# Patient Record
Sex: Male | Born: 1975 | Race: White | Hispanic: No | Marital: Married | Smoking: Former smoker
Health system: Southern US, Community
[De-identification: ages and names within clinical notes are randomized; demographics above are authoritative.]

## PROBLEM LIST (undated history)

## (undated) DIAGNOSIS — G43909 Migraine, unspecified, not intractable, without status migrainosus: Secondary | ICD-10-CM

## (undated) HISTORY — DX: Migraine, unspecified, not intractable, without status migrainosus: G43.909

---

## 2006-03-22 ENCOUNTER — Ambulatory Visit: Payer: Self-pay | Admitting: Family Medicine

## 2006-03-27 ENCOUNTER — Ambulatory Visit: Payer: Self-pay | Admitting: Family Medicine

## 2006-03-28 ENCOUNTER — Emergency Department: Payer: Self-pay

## 2006-04-16 ENCOUNTER — Ambulatory Visit: Payer: Self-pay | Admitting: Family Medicine

## 2006-06-06 ENCOUNTER — Ambulatory Visit: Payer: Self-pay | Admitting: Family Medicine

## 2007-05-28 DIAGNOSIS — G43909 Migraine, unspecified, not intractable, without status migrainosus: Secondary | ICD-10-CM | POA: Insufficient documentation

## 2007-07-17 ENCOUNTER — Ambulatory Visit: Payer: Self-pay | Admitting: Family Medicine

## 2007-11-05 ENCOUNTER — Ambulatory Visit: Payer: Self-pay | Admitting: Family Medicine

## 2007-11-05 LAB — CONVERTED CEMR LAB
Albumin: 4.6 g/dL (ref 3.5–5.2)
BUN: 14 mg/dL (ref 6–23)
Creatinine, Ser: 1 mg/dL (ref 0.4–1.5)
Direct LDL: 144.7 mg/dL
GFR calc Af Amer: 111 mL/min
GFR calc non Af Amer: 92 mL/min
HDL: 54.6 mg/dL (ref >39.0)
Potassium: 4.7 meq/L (ref 3.5–5.1)
Total CHOL/HDL Ratio: 3.8
VLDL: 11 mg/dL (ref 0–40)

## 2007-11-12 ENCOUNTER — Ambulatory Visit: Payer: Self-pay | Admitting: Family Medicine

## 2007-11-12 DIAGNOSIS — G5600 Carpal tunnel syndrome, unspecified upper limb: Secondary | ICD-10-CM

## 2007-11-12 LAB — CONVERTED CEMR LAB
Blood in Urine, dipstick: NEGATIVE
Ketones, urine, test strip: NEGATIVE
Nitrite: NEGATIVE
Protein, U semiquant: NEGATIVE

## 2007-11-14 ENCOUNTER — Encounter: Payer: Self-pay | Admitting: Family Medicine

## 2009-03-24 ENCOUNTER — Telehealth: Payer: Self-pay | Admitting: Family Medicine

## 2009-07-25 ENCOUNTER — Telehealth: Payer: Self-pay | Admitting: Family Medicine

## 2010-03-06 ENCOUNTER — Encounter (INDEPENDENT_AMBULATORY_CARE_PROVIDER_SITE_OTHER): Payer: Self-pay | Admitting: *Deleted

## 2010-08-01 ENCOUNTER — Telehealth: Payer: Self-pay | Admitting: Family Medicine

## 2010-08-09 ENCOUNTER — Ambulatory Visit: Admit: 2010-08-09 | Payer: Self-pay | Admitting: Family Medicine

## 2010-08-29 NOTE — Letter (Signed)
Summary: Dean Yang letter  [place] [facility]  [address] [place], [place] [address]   Phone: [phone]  Fax: [phone]       03/06/2010 MRN: [id number]  [facility] [address] [place], [place]  [address]  Dear [name],  [facility] announce the retirement of Arta Silence, M.D., from full-time practice at the [facility] office effective January 26, 2010 and his plans of returning part-time.  It is important to [name] and to our practice that you understand that [facility] has seven physicians in our office for your health care needs.  We will continue to offer the same exceptional care that you have today.    [name] has spoken to many of you about his plans for retirement and returning part-time in the fall.   We will continue to work with you through the transition to schedule appointments for you in the office and meet the high standards that [facility] is committed to.   Again, it is with great pleasure that we share the news that [name] will return to [facility] in October of 2011 with a reduced schedule.    If you have any questions, or would like to request an appointment with one of our physicians, please call us at [phone] and press the option for Scheduling an appointment.  We take pleasure in providing you with excellent patient care and look forward to seeing you at your next office visit.  Our [facility] Physicians are:  Tillman Abide, M.D. Laurita Quint, M.D. Roxy Manns, M.D. Kerby Nora, M.D. Hannah Beat, M.D. Ruthe Mannan, M.D. We proudly welcomed Raechel Ache, M.D. and Eustaquio Boyden, M.D. to the practice in July/August 2011.  Sincerely,  [facility]

## 2010-08-31 NOTE — Progress Notes (Signed)
Summary: Rx Maxalt  Phone Note Refill Request Call back at [phone] Message from:  [facility] on August 01, 2010 9:30 AM  Refills Requested: Medication #1:  MAXALT-MLT 10 MG  TBDP Take one by mouth as directed   Last Refilled: 07/26/2009 Patient has not been seen in over a year.   Method Requested: Electronic Initial call taken by: Sydell Axon LPN,  August 01, 2010 9:31 AM  Follow-up for Phone Call        Pt hasn't been seen in 2 years. Needs to be seen for this refill. Follow-up by: Shaune Leeks MD,  August 01, 2010 12:24 PM  Additional Follow-up for Phone Call Additional follow up Details #1::        Pharmacy notified as instructed by telephone. Additional Follow-up by: Sydell Axon LPN,  August 01, 2010 2:50 PM

## 2010-09-13 ENCOUNTER — Telehealth: Payer: Self-pay | Admitting: Family Medicine

## 2010-09-20 NOTE — Progress Notes (Signed)
Summary: Rx Acyclovir  Phone Note Refill Request Call back at [phone] Message [facility]:  [id number] on September 13, 2010 9:14 AM  Refills Requested: Medication #1:  ACYCLOVIR 400 MG TABS one tab by mouth 5 times a day (every 4 hrs while awake) for 5 days.   Last Refilled: 04/21/2010 PATIENT HAS NOT BEEN SEEN IN 3 YEARS, HAS CANCELLED THE LAST 2 APPOINTMENTS.  Initial call taken by: Sydell Axon LPN,  September 13, 2010 9:14 AM  Follow-up for Phone Call        Needs to be seen. Follow-up by: Shaune Leeks MD,  September 13, 2010 9:46 AM  Additional Follow-up for Phone Call Additional follow up Details #1::        Pharmacy notified. Additional Follow-up by: Sydell Axon LPN,  September 13, 2010 9:47 AM

## 2010-12-15 NOTE — Assessment & Plan Note (Signed)
[facility]                                 ON-CALL NOTE   LONNEL, GJERDE                        MRN:          [id number]  DATE:08/10/2007                            DOB:          27-Nov-1975    PHONE NUMBER:  [phone].   CALLER:  Shloma Roggenkamp, the wife.   OBJECTIVE:  The patient has migraines and vomiting.  It started this  morning at 4.  He started vomiting at 5 and has done so about 4-5 times.  We do not have office hours on Sunday and was told to go to urgent care  where they should be able to treat him.   PRIMARY CARE Kameisha Malicki:  Arta Silence, M.D.   HOME OFFICE:  [place].     Arta Silence, MD  Electronically Signed    RNS/MedQ  DD: 08/10/2007  DT: 08/10/2007  Job #: [id number]

## 2010-12-15 NOTE — Assessment & Plan Note (Signed)
[facility]                             [place] OFFICE NOTE   Dean Yang, Dean Yang                        MRN:          [id number]  DATE:03/22/2006                            DOB:          Nov 12, 1975    This is  35 year old white male new to the practice.  Here to establish.  His wife is a patient here.  He lives in [place].  He has had a sinus  infection a couple of months ago and had to go to the walk-in clinic at  [facility]. Wanted to be seen here but was told as a new patient, he  could not do that.  He has had a few sinus infections in the past and has  had headaches that have been thought to be migraines. He is going to a  chiropractor which has helped somewhat and he has had an MRI of the head  that is presumably normal.  Has had mild muscle strain as well.  Imitrex was  of no help to his headaches.  Maxalt did help some.  Phenergan he thinks was  the best because it helped him to sleep. He has no real problems today.   PAST MEDICAL HISTORY:  He has never been hospitalized for had surgery.  Denies rheumatic fever, scarlet fever, or high blood pressure or heart  disease, asthma, liver disease, diabetes, or thyroid disease.  He had  pneumonia in February 2005 with pleurisy and has had strep in the past.   ALLERGIES:  No known drug allergies.   HABITS:  Smoked a pack a day for about 10 years.  Quit in February of 2005.  Drinks once a year. Does not use drugs.   MEDICATIONS:  Is on no medicines except for occasional migraine medicines.   REVIEW OF SYSTEMS:  Significant for headache once a month, ringing of the  ears with loud noises. Last physical exam was in May.  Has a mid upper back  tattoo done professionally. Otherwise HEENT, teeth and gums,  cardiorespiratory, gastrointestinal, and genitourinary systems are  noncontributory.   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  He is married.  Lives  with his wife and two  children, 20 year old and 46-year-old daughters.   FAMILY HISTORY:  Father is alive at the age of 32.  Mother is alive at the  age of 60.  One sister alive at the age of 80.  Paternal grandfather died of  presumably lung cancer from smoking.  Last tetanus shot was in 2000.   PHYSICAL EXAMINATION:  GENERAL:  This is a well-developed, well-nourished,  35 year old white male in no acute distress.  VITAL SIGNS:  Temperature 97.8, pulse 56, blood pressure 106/66.  Weight is  160 pounds, height 74 inches with no known drug allergies.  HEENT:  Within normal limits.  NECK:  Without adenopathy.  Thyroid without nodularity.  LUNGS:  Clear to auscultation.  BACK:  Straight and nontender with no CVA tenderness.  HEART:  Regular rate, without murmur.  Pulses 2+ throughout.  Carotids  without bruits.  CHEST:  Symmetric to excursion.  ABDOMEN:  Soft, nontender, good bowel sounds.  No masses.  GU:  Testicles are distended bilaterally without nodularity.  No hernias or  adenopathy are noted.  RECTAL:  No rectal was done due to age.  MUSCULOSKELETAL: Muscle strength is 5/5 with range of motion, gait and  mobility normal.  EXTREMITIES:  Without clubbing, cyanosis or edema.  SKIN:  Significant for a sun tattoo in the mid upper back.  Otherwise  benign.   ASSESSMENT:  Health maintenance physical examination with migraine headaches  and upper respiratory infection.   PLAN:  Trial of Maxalt as needed, maybe be repeated once in two hours.  Samples were given.  Call if he needs more.  Robitussin plain two  tablespoons morning and at noon for four or five days with inflammation and  Aleve two with breakfast and supper for four to five days with inflammation.  We discussed alcohol, drugs, and driving.  Return for fasting labs.  He has  used Toradol and Phenergan for headaches in the past which has helped.                                   Arta Silence, MD   RNS/MedQ  DD:  03/22/2006  DT:   03/23/2006  Job #:  [id number]

## 2014-02-12 ENCOUNTER — Ambulatory Visit: Payer: Self-pay | Admitting: General Practice

## 2015-05-24 ENCOUNTER — Ambulatory Visit: Payer: Self-pay | Admitting: Physician Assistant

## 2015-05-24 ENCOUNTER — Encounter: Payer: Self-pay | Admitting: Physician Assistant

## 2015-05-24 VITALS — BP 110/70 | HR 87 | Temp 98.6°F

## 2015-05-24 DIAGNOSIS — G43019 Migraine without aura, intractable, without status migrainosus: Secondary | ICD-10-CM

## 2015-05-24 MED ORDER — PROMETHAZINE HCL 25 MG PO TABS: 25.0000 mg | ORAL_TABLET | Freq: Three times a day (TID) | ORAL | Status: DC | PRN

## 2015-05-24 MED ORDER — KETOROLAC TROMETHAMINE 10 MG PO TABS: 10.0000 mg | ORAL_TABLET | Freq: Four times a day (QID) | ORAL | Status: DC | PRN

## 2015-05-24 MED ORDER — KETOROLAC TROMETHAMINE 60 MG/2ML IM SOLN: 60.0000 mg | Freq: Once | INTRAMUSCULAR | Status: AC

## 2015-05-24 NOTE — Patient Instructions (Signed)
Recurrent Migraine Headache A migraine headache is an intense, throbbing pain on one or both sides of your head. Recurrent migraines keep coming back. A migraine can last for 30 minutes to several hours. CAUSES  The exact cause of a migraine headache is not always known. However, a migraine may be caused when nerves in the brain become irritated and release chemicals that cause inflammation. This causes pain. Certain things may also trigger migraines, such as:   Alcohol.  Smoking.  Stress.  Menstruation.  Aged cheeses.  Foods or drinks that contain nitrates, glutamate, aspartame, or tyramine.  Lack of sleep.  Chocolate.  Caffeine.  Hunger.  Physical exertion.  Fatigue.  Medicines used to treat chest pain (nitroglycerine), birth control pills, estrogen, and some blood pressure medicines. SYMPTOMS   Pain on one or both sides of your head.  Pulsating or throbbing pain.  Severe pain that prevents daily activities.  Pain that is aggravated by any physical activity.  Nausea, vomiting, or both.  Dizziness.  Pain with exposure to bright lights, loud noises, or activity.  General sensitivity to bright lights, loud noises, or smells. Before you get a migraine, you may get warning signs that a migraine is coming (aura). An aura may include:  Seeing flashing lights.  Seeing bright spots, halos, or zigzag lines.  Having tunnel vision or blurred vision.  Having feelings of numbness or tingling.  Having trouble talking.  Having muscle weakness. DIAGNOSIS  A recurrent migraine headache is often diagnosed based on:  Symptoms.  Physical examination.  A CT scan or MRI of your head. These imaging tests cannot diagnose migraines but can help rule out other causes of headaches.  TREATMENT  Medicines may be given for pain and nausea. Medicines can also be given to help prevent recurrent migraines. HOME CARE INSTRUCTIONS  Only take over-the-counter or prescription  medicines for pain or discomfort as directed by your health care provider. The use of long-term narcotics is not recommended.  Lie down in a dark, quiet room when you have a migraine.  Keep a journal to find out what may trigger your migraine headaches. For example, write down:  What you eat and drink.  How much sleep you get.  Any change to your diet or medicines.  Limit alcohol consumption.  Quit smoking if you smoke.  Get 7-9 hours of sleep, or as recommended by your health care provider.  Limit stress.  Keep lights dim if bright lights bother you and make your migraines worse. SEEK MEDICAL CARE IF:   You do not get relief from the medicines given to you.  You have a recurrence of pain.  You have a fever. SEEK IMMEDIATE MEDICAL CARE IF:  Your migraine becomes severe.  You have a stiff neck.  You have loss of vision.  You have muscular weakness or loss of muscle control.  You start losing your balance or have trouble walking.  You feel faint or pass out.  You have severe symptoms that are different from your first symptoms. MAKE SURE YOU:   Understand these instructions.  Will watch your condition.  Will get help right away if you are not doing well or get worse.   This information is not intended to replace advice given to you by your health care provider. Make sure you discuss any questions you have with your health care provider.   Document Released: 04/10/2001 Document Revised: 08/06/2014 Document Reviewed: 03/23/2013 Elsevier Interactive Patient Education [employer].

## 2015-05-24 NOTE — Progress Notes (Signed)
S: c/o migraine headache, hx of same, usually can head them off with maxalt, but this time he woke up with headache, took maxalt and it didn't help, usually urgent care will give him toradol and phenergan and he gets better, vomited x 5 due to pain, no slurred speech or neuro deficits per wife, denies fever/chills  O: vitals wnl, nad, pt holding head in pain, sensitive to light and sound, perrl eomi, lungs c t a, cv rrr , neuro intact; rechecked pt after injection, states pain decreased  A: acute migraine  P; toradol 80m im here in clinic, rx for phenergan 263m#20 nr, toradol 1061m20 nr

## 2016-02-22 IMAGING — CR RIGHT THUMB 2+V
1 series · 3 of 3 positions shown · non-contrast
Comparison: None.

CLINICAL DATA: Pain and swelling for 1 week. Limited range of
motion. No trauma.

EXAM:
RIGHT THUMB 2+V

[Series 1: kdxr thumb right hand (1st digit · 0.14mm/px · 3 of 3 slices shown]
[im 1/3]
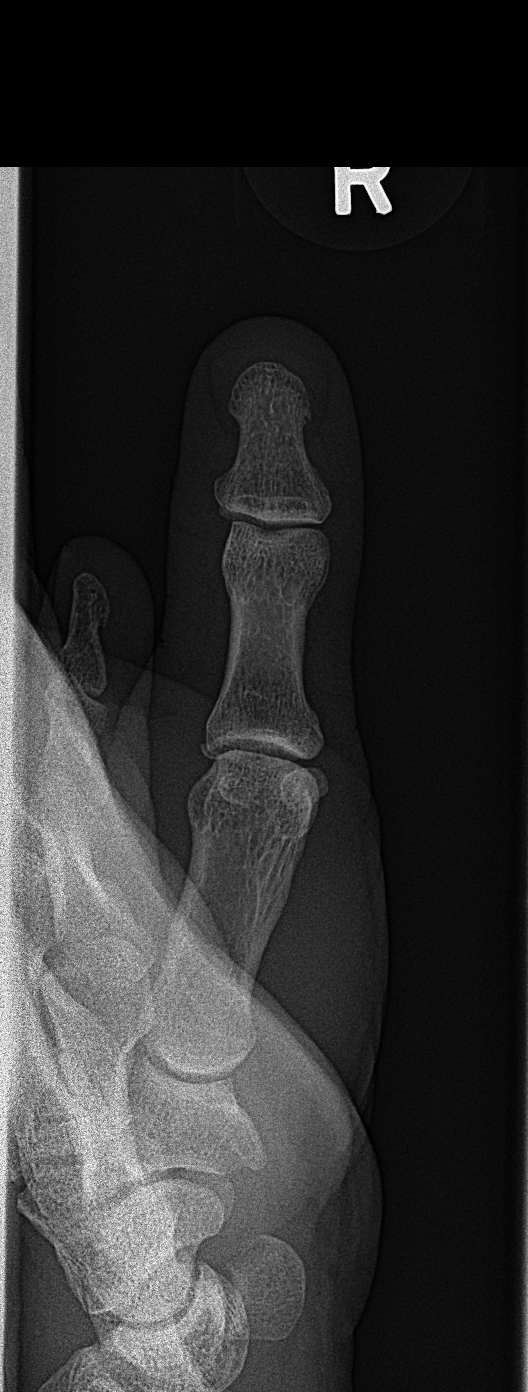
[im 2/3]
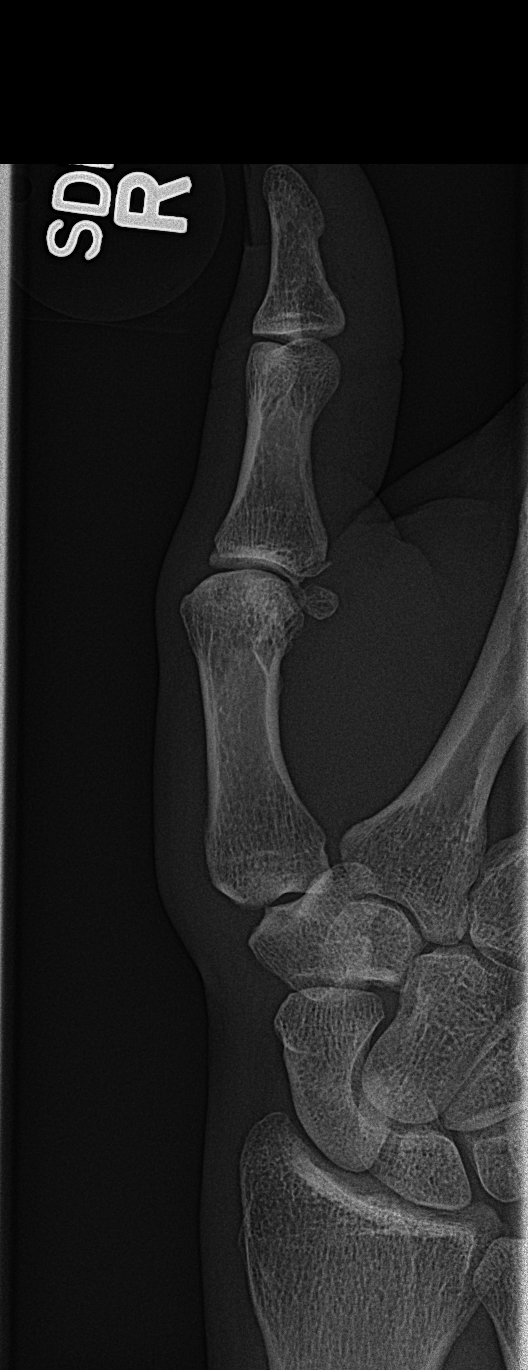
[im 3/3]
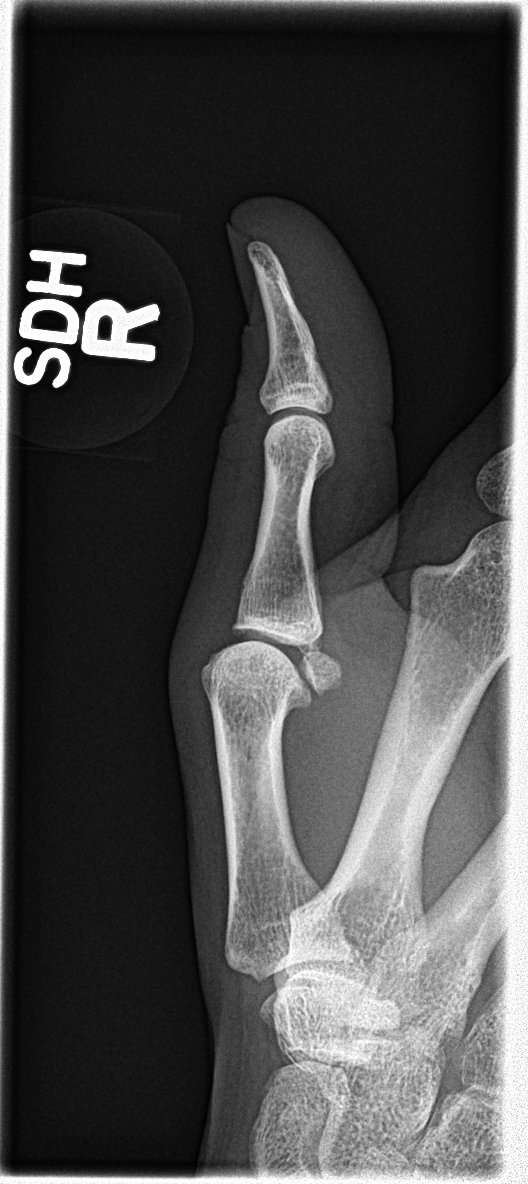

[3 of 3 positions shown; findings below may reference images not displayed]

FINDINGS: Mild joint space narrowing and subchondral sclerosis involving the
first metacarpal phalangeal joint. On the oblique image, a
nondisplaced to minimally displaced fracture is identified about the
ulnar and volar side of the proximal portion of the proximal
phalanx. This is likely intra-articular. Mild soft tissue swelling.
IMPRESSION: Mild degenerative changes of the first metatarsal phalangeal joint.
Fracture involving the proximal aspect of the proximal phalanx is
intra-articular and favored to be acute or subacute.

## 2016-12-25 ENCOUNTER — Encounter: Payer: Self-pay | Admitting: Physician Assistant

## 2016-12-25 ENCOUNTER — Ambulatory Visit: Payer: Self-pay | Admitting: Physician Assistant

## 2016-12-25 VITALS — BP 113/73 | HR 69 | Temp 98.5°F | Ht 74.0 in | Wt 168.0 lb

## 2016-12-25 DIAGNOSIS — Z0000 Encounter for general adult medical examination without abnormal findings: Secondary | ICD-10-CM

## 2016-12-25 MED ORDER — KETOROLAC TROMETHAMINE 10 MG PO TABS: 10.0000 mg | ORAL_TABLET | Freq: Four times a day (QID) | ORAL | 1 refills | Status: DC | PRN

## 2016-12-25 MED ORDER — RIZATRIPTAN BENZOATE 10 MG PO TBDP: 10.0000 mg | ORAL_TABLET | ORAL | 6 refills | Status: DC | PRN

## 2016-12-25 NOTE — Progress Notes (Signed)
S: pt here for wellness physical had  biometrics for insurance purposes done at work, total cholesterol was 186, hdl 46, glucose 97 , no complaints ros neg. PMH:   migraines Social:  Former smoker Fam:as noted on chart  O: vitals wnl, nad, ENT wnl, neck supple no lymph, lungs c t a, cv rrr, abd soft nontender bs normal all 4 quads  A: wellness physical  P: return in July/august for repeat physical and fasting labs

## 2017-12-12 ENCOUNTER — Ambulatory Visit: Payer: Self-pay | Admitting: Family Medicine

## 2017-12-12 VITALS — BP 126/64 | HR 56 | Temp 98.2°F | Resp 14 | Ht 73.0 in | Wt 172.0 lb

## 2017-12-12 DIAGNOSIS — Z0189 Encounter for other specified special examinations: Principal | ICD-10-CM

## 2017-12-12 DIAGNOSIS — Z008 Encounter for other general examination: Secondary | ICD-10-CM

## 2017-12-12 LAB — GLUCOSE, POCT (MANUAL RESULT ENTRY): POC GLUCOSE: 96 mg/dL (ref 70–99)

## 2017-12-12 NOTE — Progress Notes (Signed)
Subjective: Annual biometrics screening  Patient presents for his annual biometric screening. Patient reports eating a healthy, well-rounded diet and getting regular exercise. Patient works for the [employer]. Patient denies any other issues or concerns.   Review of Systems Unremarkable  Objective  Physical Exam General: Awake, alert and oriented. No acute distress. Well developed, hydrated and nourished. Appears stated age.  HEENT: Supple neck without adenopathy. Sclera is non-icteric. The ear canal is clear without discharge. The tympanic membrane is normal in appearance with normal landmarks and cone of light. Nasal mucosa is pink and moist. Oral mucosa is pink and moist. The pharynx is normal in appearance without tonsillar swelling or exudates.  Skin: Skin in warm, dry and intact without rashes or lesions. Appropriate color for ethnicity. Cardiac: Heart rate and rhythm are normal. No murmurs, gallops, or rubs are auscultated.  Respiratory: The chest wall is symmetric and without deformity. No signs of respiratory distress. Lung sounds are clear in all lobes bilaterally without rales, ronchi, or wheezes.  Neurological: The patient is awake, alert and oriented to person, place, and time with normal speech.  Memory is normal and thought processes intact. No gait abnormalities are appreciated.  Psychiatric: Appropriate mood and affect.   Assessment Annual biometrics screening  Plan  Lipid panel pending. Encouraged routine visits with primary care provider.  Fasting blood sugar is 96 today. Encouraged patient to get regular exercise and eat a healthy, well-rounded diet.

## 2017-12-13 LAB — LIPID PANEL
Chol/HDL Ratio: 4 ratio (ref 0.0–5.0)
Cholesterol, Total: 218 mg/dL — ABNORMAL HIGH (ref 100–199)
HDL: 55 mg/dL (ref >39)
LDL CALC: 144 mg/dL — AB (ref 0–99)
Triglycerides: 93 mg/dL (ref 0–149)
VLDL CHOLESTEROL CAL: 19 mg/dL (ref 5–40)

## 2017-12-16 NOTE — Progress Notes (Signed)
Dean Yang, Will you call the patient and inform them that their lipid panel came back?  Everything is normal, with the exception of his total cholesterol and LDL cholesterol.  The total cholesterol is elevated at 218, normal values are between 100 and 199. The LDL cholesterol ("bad cholesterol") is elevated at 144, normal values are below 99. Please advise the patient to follow-up with their primary care provider regarding these results.

## 2018-12-29 HISTORY — PX: WISDOM TOOTH EXTRACTION: SHX21

## 2019-02-03 ENCOUNTER — Other Ambulatory Visit: Payer: Self-pay

## 2019-02-03 ENCOUNTER — Ambulatory Visit: Payer: Managed Care, Other (non HMO) | Admitting: Adult Health

## 2019-02-03 ENCOUNTER — Telehealth: Payer: Self-pay | Admitting: *Deleted

## 2019-02-03 ENCOUNTER — Encounter: Payer: Self-pay | Admitting: Adult Health

## 2019-02-03 DIAGNOSIS — Z20822 Contact with and (suspected) exposure to covid-19: Secondary | ICD-10-CM

## 2019-02-03 DIAGNOSIS — Z20828 Contact with and (suspected) exposure to other viral communicable diseases: Secondary | ICD-10-CM | POA: Diagnosis not present

## 2019-02-03 NOTE — Patient Instructions (Signed)
Person Under Monitoring Name: Dean Yang  Location: [address] [place] [address]   Infection Prevention Recommendations for Individuals Confirmed to have, or Being Evaluated for, 2019 Novel Coronavirus (COVID-19) Infection Who Receive Care at Home  Individuals who are confirmed to have, or are being evaluated for, COVID-19 should follow the prevention steps below until a healthcare provider or local or state health department says they can return to normal activities.  Stay home except to get medical care You should restrict activities outside your home, except for getting medical care. Do not go to work, school, or public areas, and do not use public transportation or taxis.  Call ahead before visiting your doctor Before your medical appointment, call the healthcare provider and tell them that you have, or are being evaluated for, COVID-19 infection. This will help the healthcare provider's office take steps to keep other people from getting infected. Ask your healthcare provider to call the local or state health department.  Monitor your symptoms Seek prompt medical attention if your illness is worsening (e.g., difficulty breathing). Before going to your medical appointment, call the healthcare provider and tell them that you have, or are being evaluated for, COVID-19 infection. Ask your healthcare provider to call the local or state health department.  Wear a facemask You should wear a facemask that covers your nose and mouth when you are in the same room with other people and when you visit a healthcare provider. People who live with or visit you should also wear a facemask while they are in the same room with you.  Separate yourself from other people in your home As much as possible, you should stay in a different room from other people in your home. Also, you should use a separate bathroom, if available.  Avoid sharing household items You should not share  dishes, drinking glasses, cups, eating utensils, towels, bedding, or other items with other people in your home. After using these items, you should wash them thoroughly with soap and water.  Cover your coughs and sneezes Cover your mouth and nose with a tissue when you cough or sneeze, or you can cough or sneeze into your sleeve. Throw used tissues in a lined trash can, and immediately wash your hands with soap and water for at least 20 seconds or use an alcohol-based hand rub.  Wash your [facility] your hands often and thoroughly with soap and water for at least 20 seconds. You can use an alcohol-based hand sanitizer if soap and water are not available and if your hands are not visibly dirty. Avoid touching your eyes, nose, and mouth with unwashed hands.   Prevention Steps for Caregivers and Household Members of Individuals Confirmed to have, or Being Evaluated for, COVID-19 Infection Being Cared for in the Home  If you live with, or provide care at home for, a person confirmed to have, or being evaluated for, COVID-19 infection please follow these guidelines to prevent infection:  Follow healthcare provider's instructions Make sure that you understand and can help the patient follow any healthcare provider instructions for all care.  Provide for the patient's basic needs You should help the patient with basic needs in the home and provide support for getting groceries, prescriptions, and other personal needs.  Monitor the patient's symptoms If they are getting sicker, call his or her medical provider and tell them that the patient has, or is being evaluated for, COVID-19 infection. This will help the healthcare provider's  office take steps to keep other people from getting infected. Ask the healthcare provider to call the local or state health department.  Limit the number of people who have contact with the patient  If possible, have only one caregiver for the patient.  Other  household members should stay in another home or place of residence. If this is not possible, they should stay  in another room, or be separated from the patient as much as possible. Use a separate bathroom, if available.  Restrict visitors who do not have an essential need to be in the home.  Keep older adults, very young children, and other sick people away from the patient Keep older adults, very young children, and those who have compromised immune systems or chronic health conditions away from the patient. This includes people with chronic heart, lung, or kidney conditions, diabetes, and cancer.  Ensure good ventilation Make sure that shared spaces in the home have good air flow, such as from an air conditioner or an opened window, weather permitting.  Wash your hands often  Wash your hands often and thoroughly with soap and water for at least 20 seconds. You can use an alcohol based hand sanitizer if soap and water are not available and if your hands are not visibly dirty.  Avoid touching your eyes, nose, and mouth with unwashed hands.  Use disposable paper towels to dry your hands. If not available, use dedicated cloth towels and replace them when they become wet.  Wear a facemask and gloves  Wear a disposable facemask at all times in the room and gloves when you touch or have contact with the patient's blood, body fluids, and/or secretions or excretions, such as sweat, saliva, sputum, nasal mucus, vomit, urine, or feces.  Ensure the mask fits over your nose and mouth tightly, and do not touch it during use.  Throw out disposable facemasks and gloves after using them. Do not reuse.  Wash your hands immediately after removing your facemask and gloves.  If your personal clothing becomes contaminated, carefully remove clothing and launder. Wash your hands after handling contaminated clothing.  Place all used disposable facemasks, gloves, and other waste in a lined container before  disposing them with other household waste.  Remove gloves and wash your hands immediately after handling these items.  Do not share dishes, glasses, or other household items with the patient  Avoid sharing household items. You should not share dishes, drinking glasses, cups, eating utensils, towels, bedding, or other items with a patient who is confirmed to have, or being evaluated for, COVID-19 infection.  After the person uses these items, you should wash them thoroughly with soap and water.  Wash laundry thoroughly  Immediately remove and wash clothes or bedding that have blood, body fluids, and/or secretions or excretions, such as sweat, saliva, sputum, nasal mucus, vomit, urine, or feces, on them.  Wear gloves when handling laundry from the patient.  Read and follow directions on labels of laundry or clothing items and detergent. In general, wash and dry with the warmest temperatures recommended on the label.  Clean all areas the individual has used often  Clean all touchable surfaces, such as counters, tabletops, doorknobs, bathroom fixtures, toilets, phones, keyboards, tablets, and bedside tables, every day. Also, clean any surfaces that may have blood, body fluids, and/or secretions or excretions on them.  Wear gloves when cleaning surfaces the patient has come in contact with.  Use a diluted bleach solution (e.g., dilute bleach with 1  part bleach and 10 parts water) or a household disinfectant with a label that says EPA-registered for coronaviruses. To make a bleach solution at home, add 1 tablespoon of bleach to 1 quart (4 cups) of water. For a larger supply, add  cup of bleach to 1 gallon (16 cups) of water.  Read labels of cleaning products and follow recommendations provided on product labels. Labels contain instructions for safe and effective use of the cleaning product including precautions you should take when applying the product, such as wearing gloves or eye protection  and making sure you have good ventilation during use of the product.  Remove gloves and wash hands immediately after cleaning.  Monitor yourself for signs and symptoms of illness Caregivers and household members are considered close contacts, should monitor their health, and will be asked to limit movement outside of the home to the extent possible. Follow the monitoring steps for close contacts listed on the symptom monitoring form.   ? If you have additional questions, contact your local health department or call the epidemiologist on call at [phone] (available 24/7). ? This guidance is subject to change. For the most up-to-date guidance from [facility], please refer to their website: [email or web address]

## 2019-02-03 NOTE — Telephone Encounter (Signed)
-----   Message from Doreen Beam, [place] sent at 02/03/2019 11:17 AM EDT ----- Needs covid testing direct exposure

## 2019-02-03 NOTE — Telephone Encounter (Signed)
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 In order to return to work he needs 2 negative covid tests 24 hours apart. Instructions given and order placed. Appt made 02/04/19 @ 10:00 am @ [facility] and appt made 02/05/19 @ 1:00pm [facility].

## 2019-02-03 NOTE — Progress Notes (Signed)
Virtual Visit via Video Note  I connected with Dean Yang on 02/03/19 at 10:30 AM EDT by a video enabled telemedicine application and verified that I am speaking with the correct person using two identifiers.  Location: Patient: at home  Provider: [facility], [place], [place]     I discussed the limitations of evaluation and management by telemedicine and the availability of in person appointments. The patient expressed understanding and agreed to proceed.  History of Present Illness:  Patient is a 43 year old male in no acute distress who calls clinic for telephone visit.  He was exposed to Covid 19 at work July 1st.   [facility]. His wife has lupus immunocompromises with lupus. He has no results.  He is asymptomatic as is all his family.    Patient  denies any fever, body aches,chills, rash, chest pain, shortness of breath, nausea, vomiting, or diarrhea.  He is not immunocompromised.    Observations/Objective: Afebrile. No other vitals available.  Patient is alert and oriented and responsive to questions Engages in conversation with provider. Speaks in full sentences without any pauses without any shortness of breath or distress.    Assessment and Plan:  1. Close Exposure to Covid-19 Virus     Follow Up Instructions   Discussed RED FLAGS of Covid 19 and to go to the emergency room for any respiratory distress or moderate to severe symptoms, Covid 19 drive through testing at the Covid 19 testing center has been ordered. Testing center will call results. Work note will be sent to your My Chart account. You must stay out to the day on your work note and you will not be allowed to return unless you are fever free for 72 hours without fever reducers as well. If patient is symptomatic or has fever on return to work date they will call the office and stay home for further instructions. Call the clinic should any  symptoms change or worsen at any time. Seek emergency treatment if needed any time.    I discussed the assessment and treatment plan with the patient. The patient was provided an opportunity to ask questions and all were answered. The patient agreed with the plan and demonstrated an understanding of the instructions.   The patient was advised to call back or seek an in-person evaluation if the symptoms worsen or if the condition fails to improve as anticipated.  I provided 20 minutes of non-face-to-face time during this encounter.   Marcille Buffy, FNP

## 2019-02-04 ENCOUNTER — Other Ambulatory Visit: Payer: Self-pay

## 2019-02-04 DIAGNOSIS — Z20822 Contact with and (suspected) exposure to covid-19: Secondary | ICD-10-CM

## 2019-02-05 ENCOUNTER — Other Ambulatory Visit: Payer: Self-pay

## 2019-02-09 LAB — NOVEL CORONAVIRUS, NAA: SARS-CoV-2, NAA: NOT DETECTED

## 2019-02-12 LAB — NOVEL CORONAVIRUS, NAA: SARS-CoV-2, NAA: NOT DETECTED

## 2019-03-02 ENCOUNTER — Encounter: Payer: Managed Care, Other (non HMO) | Admitting: Adult Health

## 2019-03-05 ENCOUNTER — Ambulatory Visit: Payer: Managed Care, Other (non HMO) | Admitting: Adult Health

## 2019-03-05 ENCOUNTER — Encounter: Payer: Self-pay | Admitting: Adult Health

## 2019-03-05 ENCOUNTER — Other Ambulatory Visit: Payer: Self-pay

## 2019-03-05 VITALS — BP 117/72 | HR 67 | Temp 98.1°F | Resp 16 | Ht 74.0 in | Wt 168.0 lb

## 2019-03-05 DIAGNOSIS — Z760 Encounter for issue of repeat prescription: Secondary | ICD-10-CM

## 2019-03-05 DIAGNOSIS — Z0189 Encounter for other specified special examinations: Secondary | ICD-10-CM | POA: Diagnosis not present

## 2019-03-05 DIAGNOSIS — Z008 Encounter for other general examination: Secondary | ICD-10-CM

## 2019-03-05 DIAGNOSIS — Z8669 Personal history of other diseases of the nervous system and sense organs: Secondary | ICD-10-CM | POA: Diagnosis not present

## 2019-03-05 MED ORDER — RIZATRIPTAN BENZOATE 10 MG PO TBDP: 10.0000 mg | ORAL_TABLET | ORAL | 0 refills | Status: DC | PRN

## 2019-03-05 NOTE — Patient Instructions (Signed)
The Biometric exam is a brief physical with labs including glucose and cholesterol. This does not replace a full physical with a primary care provider, and additional recommended labs. This is an acute care clinic not for maintenance of chronic or long standing conditions.   Provider also recommends if you do not have a primary care provider for patient to establish care promptly.You can choose any provider of your choice at any facility of your choice, the below information is  just a resource to aid in you finding a primary care provider for routine health maintenance.   [facility]  PHYSICIAN/PROVIDER  REFERRAL LINE at [phone]  [email or web address] to help assist with finding a primary care doctor.   Helpful resources below of other primary care office's accepting new patients.   [facility]         [address]  [place]. [place] [address] [phone]  . [facility]    [address] [place], [place] [address] [phone]  . [facility] [address] [place], [place]  [phone]   . [place]  [address] [place] [address] [phone]    Follow up with primary care as needed for chronic and maintenance health care- can be seen in this employee clinic for acute care.     Health Maintenance, Male Adopting a healthy lifestyle and getting preventive care are important in promoting health and wellness. Ask your health care provider about:  The right schedule for you to have regular tests and exams.  Things you can do on your own to prevent diseases and keep yourself healthy. What should I know about diet, weight, and exercise? Eat a healthy diet   Eat a diet that includes plenty of vegetables, fruits, low-fat dairy products, and lean protein.  Do not eat a lot of foods that are high in solid fats, added sugars, or  sodium. Maintain a healthy weight Body mass index (BMI) is a measurement that can be used to identify possible weight problems. It estimates body fat based on height and weight. Your health care provider can help determine your BMI and help you achieve or maintain a healthy weight. Get regular exercise Get regular exercise. This is one of the most important things you can do for your health. Most adults should:  Exercise for at least 150 minutes each week. The exercise should increase your heart rate and make you sweat (moderate-intensity exercise).  Do strengthening exercises at least twice a week. This is in addition to the moderate-intensity exercise.  Spend less time sitting. Even light physical activity can be beneficial. Watch cholesterol and blood lipids Have your blood tested for lipids and cholesterol at 44 years of age, then have this test every 5 years. You may need to have your cholesterol levels checked more often if:  Your lipid or cholesterol levels are high.  You are older than 44 years of age.  You are at high risk for heart disease. What should I know about cancer screening? Many types of cancers can be detected early and may often be prevented. Depending on your health history and family history, you may need to have cancer screening at various ages. This may include screening for:  Colorectal cancer.  Prostate cancer.  Skin cancer.  Lung cancer. What should I know about heart disease, diabetes, and high blood pressure? Blood pressure and heart  disease  High blood pressure causes heart disease and increases the risk of stroke. This is more likely to develop in people who have high blood pressure readings, are of African descent, or are overweight.  Talk with your health care provider about your target blood pressure readings.  Have your blood pressure checked: ? Every 3-5 years if you are 56-16 years of age. ? Every year if you are 32 years old or older.   If you are between the ages of 55 and 16 and are a current or former smoker, ask your health care provider if you should have a one-time screening for abdominal aortic aneurysm (AAA). Diabetes Have regular diabetes screenings. This checks your fasting blood sugar level. Have the screening done:  Once every three years after age 62 if you are at a normal weight and have a low risk for diabetes.  More often and at a younger age if you are overweight or have a high risk for diabetes. What should I know about preventing infection? Hepatitis B If you have a higher risk for hepatitis B, you should be screened for this virus. Talk with your health care provider to find out if you are at risk for hepatitis B infection. Hepatitis C Blood testing is recommended for:  Everyone born from [birth year] through 1966.  Anyone with known risk factors for hepatitis C. Sexually transmitted infections (STIs)  You should be screened each year for STIs, including gonorrhea and chlamydia, if: ? You are sexually active and are younger than 44 years of age. ? You are older than 44 years of age and your health care provider tells you that you are at risk for this type of infection. ? Your sexual activity has changed since you were last screened, and you are at increased risk for chlamydia or gonorrhea. Ask your health care provider if you are at risk.  Ask your health care provider about whether you are at high risk for HIV. Your health care provider may recommend a prescription medicine to help prevent HIV infection. If you choose to take medicine to prevent HIV, you should first get tested for HIV. You should then be tested every 3 months for as long as you are taking the medicine. Follow these instructions at home: Lifestyle  Do not use any products that contain nicotine or tobacco, such as cigarettes, e-cigarettes, and chewing tobacco. If you need help quitting, ask your health care provider.  Do not use street  drugs.  Do not share needles.  Ask your health care provider for help if you need support or information about quitting drugs. Alcohol use  Do not drink alcohol if your health care provider tells you not to drink.  If you drink alcohol: ? Limit how much you have to 0-2 drinks a day. ? Be aware of how much alcohol is in your drink. In the [place] one drink equals one 12 oz bottle of beer (355 mL), one 5 oz glass of wine (148 mL), or one 1 oz glass of hard liquor (44 mL). General instructions  Schedule regular health, dental, and eye exams.  Stay current with your vaccines.  Tell your health care provider if: ? You often feel depressed. ? You have ever been abused or do not feel safe at home. Summary  Adopting a healthy lifestyle and getting preventive care are important in promoting health and wellness.  Follow your health care provider's instructions about healthy diet, exercising, and getting tested or screened for diseases.  Follow your health care provider's instructions on monitoring your cholesterol and blood pressure. This information is not intended to replace advice given to you by your health care provider. Make sure you discuss any questions you have with your health care provider. Document Released: 01/12/2008 Document Revised: 07/09/2018 Document Reviewed: 07/09/2018 Elsevier Patient Education  [place]. Rizatriptan tablets What is this medicine? RIZATRIPTAN (rye za TRIP tan) is used to treat migraines with or without aura. An aura is a strange feeling or visual disturbance that warns you of an attack. It is not used to prevent migraines. This medicine may be used for other purposes; ask your health care provider or pharmacist if you have questions. COMMON BRAND NAME(S): Maxalt What should I tell my health care provider before I take this medicine? They need to know if you have any of these conditions:  cigarette smoker  circulation problems in fingers  and toes  diabetes  heart disease  high blood pressure  high cholesterol  history of irregular heartbeat  history of stroke  kidney disease  liver disease  stomach or intestine problems  an unusual or allergic reaction to rizatriptan, other medicines, foods, dyes, or preservatives  pregnant or trying to get pregnant  breast-feeding How should I use this medicine? Take this medicine by mouth with a glass of water. Follow the directions on the prescription label. Do not take it more often than directed. Talk to your pediatrician regarding the use of this medicine in children. While this drug may be prescribed for children as young as 6 years for selected conditions, precautions do apply. Overdosage: If you think you have taken too much of this medicine contact a poison control center or emergency room at once. NOTE: This medicine is only for you. Do not share this medicine with others. What if I miss a dose? This does not apply. This medicine is not for regular use. What may interact with this medicine? Do not take this medicine with any of the following medicines:  certain medicines for migraine headache like almotriptan, eletriptan, frovatriptan, naratriptan, rizatriptan, sumatriptan, zolmitriptan  ergot alkaloids like dihydroergotamine, ergonovine, ergotamine, methylergonovine  MAOIs like Carbex, Eldepryl, Marplan, Nardil, and Parnate This medicine may also interact with the following medications:  certain medicines for depression, anxiety, or psychotic disorders  propranolol This list may not describe all possible interactions. Give your health care provider a list of all the medicines, herbs, non-prescription drugs, or dietary supplements you use. Also tell them if you smoke, drink alcohol, or use illegal drugs. Some items may interact with your medicine. What should I watch for while using this medicine? Visit your healthcare professional for regular checks on your  progress. Tell your healthcare professional if your symptoms do not start to get better or if they get worse. You may get drowsy or dizzy. Do not drive, use machinery, or do anything that needs mental alertness until you know how this medicine affects you. Do not stand up or sit up quickly, especially if you are an older patient. This reduces the risk of dizzy or fainting spells. Alcohol may interfere with the effect of this medicine. Your mouth may get dry. Chewing sugarless gum or sucking hard candy and drinking plenty of water may help. Contact your healthcare professional if the problem does not go away or is severe. If you take migraine medicines for 10 or more days a month, your migraines may get worse. Keep a diary of headache days and medicine use. Contact your  healthcare professional if your migraine attacks occur more frequently. What side effects may I notice from receiving this medicine? Side effects that you should report to your doctor or health care professional as soon as possible:  allergic reactions like skin rash, itching or hives, swelling of the face, lips, or tongue  chest pain or chest tightness  signs and symptoms of a dangerous change in heartbeat or heart rhythm like chest pain; dizziness; fast, irregular heartbeat; palpitations; feeling faint or lightheaded; falls; breathing problems  signs and symptoms of a stroke like changes in vision; confusion; trouble speaking or understanding; severe headaches; sudden numbness or weakness of the face, arm or leg; trouble walking; dizziness; loss of balance or coordination  signs and symptoms of serotonin syndrome like irritable; confusion; diarrhea; fast or irregular heartbeat; muscle twitching; stiff muscles; trouble walking; sweating; high fever; seizures; chills; vomiting Side effects that usually do not require medical attention (report to your doctor or health care professional if they continue or are bothersome):  diarrhea   dizziness  drowsiness  dry mouth  headache  nausea, vomiting  pain, tingling, numbness in the hands or feet  stomach pain This list may not describe all possible side effects. Call your doctor for medical advice about side effects. You may report side effects to FDA at 1-800-FDA-1088. Where should I keep my medicine? Keep out of the reach of children. Store at room temperature between 15 and 30 degrees C (59 and 86 degrees F). Keep container tightly closed. Throw away any unused medicine after the expiration date. NOTE: This sheet is a summary. It may not cover all possible information. If you have questions about this medicine, talk to your doctor, pharmacist, or health care provider.  2020 Elsevier/Gold Standard (2018-01-28 14:59:59) Migraine Headache A migraine headache is a very strong throbbing pain on one side or both sides of your head. This type of headache can also cause other symptoms. It can last from 4 hours to 3 days. Talk with your doctor about what things may bring on (trigger) this condition. What are the causes? The exact cause of this condition is not known. This condition may be triggered or caused by:  Drinking alcohol.  Smoking.  Taking medicines, such as: ? Medicine used to treat chest pain (nitroglycerin). ? Birth control pills. ? Estrogen. ? Some blood pressure medicines.  Eating or drinking certain products.  Doing physical activity. Other things that may trigger a migraine headache include:  Having a menstrual period.  Pregnancy.  Hunger.  Stress.  Not getting enough sleep or getting too much sleep.  Weather changes.  Tiredness (fatigue). What increases the risk?  Being 55-62 years old.  Being male.  Having a family history of migraine headaches.  Being Caucasian.  Having depression or anxiety.  Being very overweight. What are the signs or symptoms?  A throbbing pain. This pain may: ? Happen in any area of the head, such  as on one side or both sides. ? Make it hard to do daily activities. ? Get worse with physical activity. ? Get worse around bright lights or loud noises.  Other symptoms may include: ? Feeling sick to your stomach (nauseous). ? Vomiting. ? Dizziness. ? Being sensitive to bright lights, loud noises, or smells.  Before you get a migraine headache, you may get warning signs (an aura). An aura may include: ? Seeing flashing lights or having blind spots. ? Seeing bright spots, halos, or zigzag lines. ? Having tunnel vision or blurred  vision. ? Having numbness or a tingling feeling. ? Having trouble talking. ? Having weak muscles.  Some people have symptoms after a migraine headache (postdromal phase), such as: ? Tiredness. ? Trouble thinking (concentrating). How is this treated?  Taking medicines that: ? Relieve pain. ? Relieve the feeling of being sick to your stomach. ? Prevent migraine headaches.  Treatment may also include: ? Having acupuncture. ? Avoiding foods that bring on migraine headaches. ? Learning ways to control your body functions (biofeedback). ? Therapy to help you know and deal with negative thoughts (cognitive behavioral therapy). Follow these instructions at home: Medicines  Take over-the-counter and prescription medicines only as told by your doctor.  Ask your doctor if the medicine prescribed to you: ? Requires you to avoid driving or using heavy machinery. ? Can cause trouble pooping (constipation). You may need to take these steps to prevent or treat trouble pooping:  Drink enough fluid to keep your pee (urine) pale yellow.  Take over-the-counter or prescription medicines.  Eat foods that are high in fiber. These include beans, whole grains, and fresh fruits and vegetables.  Limit foods that are high in fat and sugar. These include fried or sweet foods. Lifestyle  Do not drink alcohol.  Do not use any products that contain nicotine or tobacco,  such as cigarettes, e-cigarettes, and chewing tobacco. If you need help quitting, ask your doctor.  Get at least 8 hours of sleep every night.  Limit and deal with stress. General instructions      Keep a journal to find out what may bring on your migraine headaches. For example, write down: ? What you eat and drink. ? How much sleep you get. ? Any change in what you eat or drink. ? Any change in your medicines.  If you have a migraine headache: ? Avoid things that make your symptoms worse, such as bright lights. ? It may help to lie down in a dark, quiet room. ? Do not drive or use heavy machinery. ? Ask your doctor what activities are safe for you.  Keep all follow-up visits as told by your doctor. This is important. Contact a doctor if:  You get a migraine headache that is different or worse than others you have had.  You have more than 15 headache days in one month. Get help right away if:  Your migraine headache gets very bad.  Your migraine headache lasts longer than 72 hours.  You have a fever.  You have a stiff neck.  You have trouble seeing.  Your muscles feel weak or like you cannot control them.  You start to lose your balance a lot.  You start to have trouble walking.  You pass out (faint).  You have a seizure. Summary  A migraine headache is a very strong throbbing pain on one side or both sides of your head. These headaches can also cause other symptoms.  This condition may be treated with medicines and changes to your lifestyle.  Keep a journal to find out what may bring on your migraine headaches.  Contact a doctor if you get a migraine headache that is different or worse than others you have had.  Contact your doctor if you have more than 15 headache days in a month. This information is not intended to replace advice given to you by your health care provider. Make sure you discuss any questions you have with your health care provider.  Document Released: 04/24/2008 Document Revised: 11/07/2018 Document  Reviewed: 08/28/2018 Elsevier Patient Education  [employer].

## 2019-03-05 NOTE — Progress Notes (Addendum)
[place] DOB: 43 y.o. MRN: [id number]  Subjective:  Here for Biometric Screen/brief exam  Patient is a 43 year old male in no acute distress who comes to the clinic for a biometric physical.   He has no concerns with his health at this time. He does have a history of migraines, he denies any change. He reports he maybe has 1- 2 migraines a month and has had them since he was a teenager.  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 He reports he had a primary care physicians last year that retired. He requests a one time refill on his Maxalt( prescription is on file).   Patient  denies any fever, body aches,chills, rash, chest pain, shortness of breath, nausea, vomiting, or diarrhea.   Objective: Physical Exam  Constitutional: He is oriented to person, place, and time and well-developed, well-nourished, and in no distress. No distress.  HENT:  Head: Normocephalic and atraumatic.  Eyes: Pupils are equal, round, and reactive to light. Conjunctivae and EOM are normal. Right eye exhibits no discharge. Left eye exhibits no discharge. No scleral icterus.  Neck: Normal range of motion. Neck supple. No JVD present.  Cardiovascular: Normal rate and regular rhythm. Exam reveals no gallop and no friction rub.  No murmur heard. Pulmonary/Chest: Effort normal and breath sounds normal. No respiratory distress. He has no wheezes. He has no rales. He exhibits no tenderness.  Abdominal: Soft.  Musculoskeletal: Normal range of motion.  Neurological: He is alert and oriented to person, place, and time. He has normal motor skills. He displays no weakness, facial symmetry, normal stance and normal reflexes. He exhibits normal muscle tone. He has a normal Romberg Test. Gait normal. Coordination and gait normal.  Skin: Skin is warm, dry and intact. No rash noted. He is not diaphoretic. No erythema.  No pallor. Nails show no clubbing.  Psychiatric: Mood, memory, affect and judgment normal.    Assessment:   ICD-10-CM   1. Encounter for other general examination- brief physical with biometric screening not a full annual physical   Z00.8   2. Encounter for biometric screening  Z01.89   3. Hx of migraines  Z86.69   4. Medication refill  Z76.0    Orders Placed This Encounter  Procedures  . CBC with Diff  . Comprehensive metabolic panel  . Lipid panel     Plan:  Meds ordered this encounter  Medications  . rizatriptan (MAXALT-MLT) 10 MG disintegrating tablet    Sig: Take 1 tablet (10 mg total) by mouth as needed for migraine. May repeat in 2 hours if needed/ see primary care for any refills    Dispense:  16 tablet    Refill:  0   Provided patient a list of primary care providers accepting new patients. He will seek care with primary care provider immediately and no additional refills will be given from this acute care clinic for maintenance/ chronic medications.  RED FLAGS with headache/ migraines ( thunder clap headache/  Worst headache in life and other neurological symptoms are discussed  and when to seek emergency medical attention.   Fasting glucose and lipids. Discussed with patient that today's visit here is a limited biometric screening visit (not a comprehensive exam or management of any chronic problems) Discussed some health issues, including healthy eating habits and exercise. Encouraged to follow-up with PCP for annual comprehensive preventive and wellness  care (and if applicable, any chronic issues). Questions invited and answered.

## 2019-03-06 ENCOUNTER — Encounter: Payer: Self-pay | Admitting: Adult Health

## 2019-03-06 LAB — COMPREHENSIVE METABOLIC PANEL
ALT: 18 IU/L (ref 0–44)
AST: 21 IU/L (ref 0–40)
Albumin/Globulin Ratio: 2 (ref 1.2–2.2)
Albumin: 5.1 g/dL — ABNORMAL HIGH (ref 4.0–5.0)
Alkaline Phosphatase: 64 IU/L (ref 39–117)
BUN/Creatinine Ratio: 10 (ref 9–20)
BUN: 12 mg/dL (ref 6–24)
Bilirubin Total: 0.8 mg/dL (ref 0.0–1.2)
CO2: 24 mmol/L (ref 20–29)
Calcium: 9.9 mg/dL (ref 8.7–10.2)
Chloride: 101 mmol/L (ref 96–106)
Creatinine, Ser: 1.15 mg/dL (ref 0.76–1.27)
GFR calc Af Amer: 89 mL/min/{1.73_m2} (ref >59)
GFR calc non Af Amer: 77 mL/min/{1.73_m2} (ref >59)
Globulin, Total: 2.5 g/dL (ref 1.5–4.5)
Glucose: 86 mg/dL (ref 65–99)
Potassium: 4.8 mmol/L (ref 3.5–5.2)
Sodium: 141 mmol/L (ref 134–144)
Total Protein: 7.6 g/dL (ref 6.0–8.5)

## 2019-03-06 LAB — CBC WITH DIFFERENTIAL/PLATELET
Basophils Absolute: 0 10*3/uL (ref 0.0–0.2)
Basos: 1 %
EOS (ABSOLUTE): 0 10*3/uL (ref 0.0–0.4)
Eos: 1 %
Hematocrit: 43.4 % (ref 37.5–51.0)
Hemoglobin: 14.5 g/dL (ref 13.0–17.7)
Immature Grans (Abs): 0 10*3/uL (ref 0.0–0.1)
Immature Granulocytes: 0 %
Lymphocytes Absolute: 1.2 10*3/uL (ref 0.7–3.1)
Lymphs: 39 %
MCH: 29.5 pg (ref 26.6–33.0)
MCHC: 33.4 g/dL (ref 31.5–35.7)
MCV: 88 fL (ref 79–97)
Monocytes Absolute: 0.2 10*3/uL (ref 0.1–0.9)
Monocytes: 7 %
Neutrophils Absolute: 1.6 10*3/uL (ref 1.4–7.0)
Neutrophils: 52 %
Platelets: 190 10*3/uL (ref 150–450)
RBC: 4.91 x10E6/uL (ref 4.14–5.80)
RDW: 12.3 % (ref 11.6–15.4)
WBC: 3 10*3/uL — ABNORMAL LOW (ref 3.4–10.8)

## 2019-03-06 LAB — LIPID PANEL
Chol/HDL Ratio: 3.7 ratio (ref 0.0–5.0)
Cholesterol, Total: 235 mg/dL — ABNORMAL HIGH (ref 100–199)
HDL: 64 mg/dL (ref >39)
LDL Calculated: 152 mg/dL — ABNORMAL HIGH (ref 0–99)
Triglycerides: 94 mg/dL (ref 0–149)
VLDL Cholesterol Cal: 19 mg/dL (ref 5–40)

## 2019-03-06 NOTE — Progress Notes (Signed)
Mr. Dean Yang,  Your white blood count is mildly low 3.0  (Normal range is 3.4-10.8). I recommend you having this rechecked in 3-4 weeks and sooner if have any new symptoms occur.  Your total cholesterol is 235 elevated as normal is 100-199. LDL bad cholesterol is elevated at 152 normal is 0-99. Please call the office if you have questions or concerns. Follow up with a primary care provider by 1 month.

## 2019-09-28 ENCOUNTER — Other Ambulatory Visit: Payer: Self-pay

## 2019-09-28 ENCOUNTER — Encounter: Payer: Self-pay | Admitting: Family Medicine

## 2019-09-28 ENCOUNTER — Ambulatory Visit: Payer: Managed Care, Other (non HMO) | Admitting: Family Medicine

## 2019-09-28 VITALS — BP 121/84 | HR 68 | Temp 97.5°F | Resp 16 | Ht 73.0 in | Wt 191.0 lb

## 2019-09-28 DIAGNOSIS — Z7689 Persons encountering health services in other specified circumstances: Secondary | ICD-10-CM

## 2019-09-28 DIAGNOSIS — G43709 Chronic migraine without aura, not intractable, without status migrainosus: Secondary | ICD-10-CM | POA: Diagnosis not present

## 2019-09-28 DIAGNOSIS — IMO0002 Reserved for concepts with insufficient information to code with codable children: Secondary | ICD-10-CM

## 2019-09-28 MED ORDER — KETOROLAC TROMETHAMINE 10 MG PO TABS: 10.0000 mg | ORAL_TABLET | Freq: Three times a day (TID) | ORAL | 1 refills | Status: DC | PRN

## 2019-09-28 MED ORDER — PROMETHAZINE HCL 12.5 MG PO TABS: 12.5000 mg | ORAL_TABLET | Freq: Three times a day (TID) | ORAL | 3 refills | Status: DC | PRN

## 2019-09-28 MED ORDER — RIZATRIPTAN BENZOATE 10 MG PO TBDP: 10.0000 mg | ORAL_TABLET | ORAL | 3 refills | Status: DC | PRN

## 2019-09-28 NOTE — Progress Notes (Signed)
Subjective:    Patient ID: Dean Yang, male    DOB: 22-May-1976, 44 y.o.   MRN: [id number]  Dean Yang is a 44 y.o. male presenting on 09/28/2019 for Establish Care (migraine)  Previous PCP no longer available. Now he goes to yearly biometric wellness through [facility].  HPI   Chronic Migraines - Chronic history of migraine headaches for years. - Current trend with Migraine headaches, seem to be once a month approximately, he describes if he can feel a migraine headache coming on with tension in neck and head, and if he takes Rizatriptan PRN and Phenergan half tab PRN but it does make him groggy and sleepy. Usually if he lays in a dark room and uses heating pad / tube socks to help settle it down, usually migraine resolve quickly. - He has seen Dr Laveda Abbe for chiropractor for neck adjustment to help with migraines. He keeps a lot of tension in muscles back in neck and seems to be related. Last visit 1 month ago. Previous chiro has retired, now he hasn't returned to new provider. - History of MRI Brain in past.  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 Migraine    Past Surgical History:  Procedure Laterality Date  . WISDOM TOOTH EXTRACTION  12/29/2018   Social History   Socioeconomic History  . Marital status: Married    Spouse name: Not on file  . Number of children: Not on file  . Years of education: Not on file  . Highest education level: Not on file  Occupational History  . Not on file  Tobacco  Use  . Smoking status: Former Research scientist (life sciences)  . Smokeless tobacco: Never Used  Substance and Sexual Activity  . Alcohol use: No    Alcohol/week: 0.0 standard drinks  . Drug use: No  . Sexual activity: Yes    Partners: Female  Other Topics Concern  . Not on file  Social History Narrative  . Not on file   Social Determinants of Health   Financial Resource Strain:   . Difficulty of Paying Living Expenses: Not on file  Food Insecurity:   . Worried About Charity fundraiser in the Last Year: Not on file  . Ran Out of Food in the Last Year: Not on file  Transportation Needs:   . Lack of Transportation (Medical): Not on file  . Lack of Transportation (Non-Medical): Not on file  Physical Activity:   . Days of Exercise per Week: Not on file  . Minutes of Exercise per Session: Not on file  Stress:   . Feeling of Stress : Not on file  Social Connections:   . Frequency of Communication with Friends and Family: Not on file  . Frequency of Social Gatherings with Friends and Family: Not on file  . Attends Religious Services: Not on file  . Active Member of Clubs or Organizations: Not on file  . Attends Archivist Meetings: Not on file  . Marital Status:  Not on file  Intimate Partner Violence:   . Fear of Current or Ex-Partner: Not on file  . Emotionally Abused: Not on file  . Physically Abused: Not on file  . Sexually Abused: Not on file   Family History  Problem Relation Age of Onset  . Heart disease Father 15       MI, CABG  . Diabetes Maternal Grandmother   . Stroke Maternal Grandfather   . Cancer Paternal Grandmother        lung cancer, smoker  . Cancer Paternal Grandfather        lung cancer, smoker   No current outpatient medications on file prior to visit.   No current facility-administered medications on file prior to visit.    Review of Systems Per HPI unless specifically indicated above     Objective:    BP 121/84   Pulse 68   Temp (!) 97.5 F (36.4 C)  (Oral)   Resp 16   Ht 6' 1"  (1.854 m)   Wt 191 lb (86.6 kg) Comment: as per patient he generally weight 165-170 lbs  BMI 25.20 kg/m   Wt Readings from Last 3 Encounters:  09/28/19 191 lb (86.6 kg)  03/05/19 168 lb (76.2 kg)  12/12/17 172 lb (78 kg)    Physical Exam Vitals and nursing note reviewed.  Constitutional:      General: He is not in acute distress.    Appearance: He is well-developed. He is not diaphoretic.     Comments: Well-appearing, comfortable, cooperative  HENT:     Head: Normocephalic and atraumatic.  Eyes:     General:        Right eye: No discharge.        Left eye: No discharge.     Conjunctiva/sclera: Conjunctivae normal.  Cardiovascular:     Rate and Rhythm: Normal rate.  Pulmonary:     Effort: Pulmonary effort is normal.  Skin:    General: Skin is warm and dry.     Findings: No erythema or rash.  Neurological:     Mental Status: He is alert and oriented to person, place, and time.  Psychiatric:        Behavior: Behavior normal.     Comments: Well groomed, good eye contact, normal speech and thoughts        Results for orders placed or performed in visit on 03/05/19  CBC with Diff  Result Value Ref Range   WBC 3.0 (L) 3.4 - 10.8 x10E3/uL   RBC 4.91 4.14 - 5.80 x10E6/uL   Hemoglobin 14.5 13.0 - 17.7 g/dL   Hematocrit 43.4 37.5 - 51.0 %   MCV 88 79 - 97 fL   MCH 29.5 26.6 - 33.0 pg   MCHC 33.4 31.5 - 35.7 g/dL   RDW 12.3 11.6 - 15.4 %   Platelets 190 150 - 450 x10E3/uL   Neutrophils 52 Not Estab. %   Lymphs 39 Not Estab. %   Monocytes 7 Not Estab. %   Eos 1 Not Estab. %   Basos 1 Not Estab. %   Neutrophils Absolute 1.6 1.4 - 7.0 x10E3/uL   Lymphocytes Absolute 1.2 0.7 - 3.1 x10E3/uL   Monocytes Absolute 0.2 0.1 - 0.9 x10E3/uL   EOS (ABSOLUTE) 0.0 0.0 - 0.4 x10E3/uL   Basophils Absolute 0.0 0.0 - 0.2 x10E3/uL   Immature Granulocytes 0 Not Estab. %   Immature Grans (Abs) 0.0 0.0 - 0.1 x10E3/uL  Comprehensive metabolic panel  Result  Value  Ref Range   Glucose 86 65 - 99 mg/dL   BUN 12 6 - 24 mg/dL   Creatinine, Ser 1.15 0.76 - 1.27 mg/dL   GFR calc non Af Amer 77 >59 mL/min/1.73   GFR calc Af Amer 89 >59 mL/min/1.73   BUN/Creatinine Ratio 10 9 - 20   Sodium 141 134 - 144 mmol/L   Potassium 4.8 3.5 - 5.2 mmol/L   Chloride 101 96 - 106 mmol/L   CO2 24 20 - 29 mmol/L   Calcium 9.9 8.7 - 10.2 mg/dL   Total Protein 7.6 6.0 - 8.5 g/dL   Albumin 5.1 (H) 4.0 - 5.0 g/dL   Globulin, Total 2.5 1.5 - 4.5 g/dL   Albumin/Globulin Ratio 2.0 1.2 - 2.2   Bilirubin Total 0.8 0.0 - 1.2 mg/dL   Alkaline Phosphatase 64 39 - 117 IU/L   AST 21 0 - 40 IU/L   ALT 18 0 - 44 IU/L  Lipid panel  Result Value Ref Range   Cholesterol, Total 235 (H) 100 - 199 mg/dL   Triglycerides 94 0 - 149 mg/dL   HDL 64 >39 mg/dL   VLDL Cholesterol Cal 19 5 - 40 mg/dL   LDL Calculated 152 (H) 0 - 99 mg/dL   Chol/HDL Ratio 3.7 0.0 - 5.0 ratio      Assessment & Plan:   Problem List Items Addressed This Visit    None    Visit Diagnoses    Chronic migraine    -  Primary   Relevant Medications   rizatriptan (MAXALT-MLT) 10 MG disintegrating tablet   promethazine (PHENERGAN) 12.5 MG tablet   ketorolac (TORADOL) 10 MG tablet   Encounter to establish care with new doctor          #Chronic Migraine Controlled on current regimen with abortive therapy PRN only Currently migraine HA x 1 monthly, resolves promptly w/ meds Not on prophylaxis. He prefers to avoid long term medication Agree to refill current abortive therapy   - Rizatriptan 59m ODT #10 pills with refills   - Ketorolac (toradol) 157mq 8 hr PRN - use very strict precautions when taking this, take with food, increase hydration, caution kidney injury if taken too frequently, avoid ibuprofen, advil aleve while taking this    -Promethazone (phenergan) 12.39m59m 8 hr PRN nausea, use only infrequently PRN nausea w/ migraine, caution sedation, can take half or whole   Meds ordered this  encounter  Medications  . rizatriptan (MAXALT-MLT) 10 MG disintegrating tablet    Sig: Take 1 tablet (10 mg total) by mouth as needed for migraine. May repeat in 2 hours if needed    Dispense:  10 tablet    Refill:  3  . promethazine (PHENERGAN) 12.5 MG tablet    Sig: Take 1 tablet (12.5 mg total) by mouth every 8 (eight) hours as needed for nausea or vomiting (migraine).    Dispense:  30 tablet    Refill:  3  . ketorolac (TORADOL) 10 MG tablet    Sig: Take 1 tablet (10 mg total) by mouth every 8 (eight) hours as needed.    Dispense:  20 tablet    Refill:  1     Follow up plan: Return in about 6 months (around 03/30/2020), or if symptoms worsen or fail to improve, for 6 month follow-up.  [facility], and he can follow-up with us [facility] that visit to review lab results and discuss  any abnormal or renew rx  Nobie Putnam, [facility] 09/28/2019, 10:36 AM

## 2019-09-28 NOTE — Patient Instructions (Addendum)
Thank you for coming to the office today.  Refilled migraine medications today, with additional refills. Let me know if any issues with those.  Reduced dose of Phenergan (promethazine) now it is 12.29m in one tablet, so you can even reduce it further by cutting in half if you want.  If you want to check in after you see your [facility] in August 2021, then that is fine and we can review your blood work if any questions.  Goal to see you once a year here in the future to update medication refills.  Please schedule a Follow-up Appointment to: Return in about 6 months (around 03/30/2020), or if symptoms worsen or fail to improve, for 6 month follow-up.  If you have any other questions or concerns, please feel free to call the office or send a message through [facility] You may also schedule an earlier appointment if necessary.  Additionally, you may be receiving a survey about your experience at our office within a few days to 1 week by e-mail or mail. We value your feedback.  ANobie Putnam DO [facility]

## 2019-12-31 ENCOUNTER — Encounter: Payer: Self-pay | Admitting: Medical

## 2019-12-31 ENCOUNTER — Other Ambulatory Visit: Payer: Self-pay | Admitting: Medical

## 2019-12-31 ENCOUNTER — Other Ambulatory Visit: Payer: Self-pay

## 2019-12-31 ENCOUNTER — Ambulatory Visit: Payer: Managed Care, Other (non HMO) | Admitting: Medical

## 2019-12-31 VITALS — BP 108/62 | HR 62 | Temp 98.0°F | Resp 16 | Ht 73.0 in | Wt 162.0 lb

## 2019-12-31 DIAGNOSIS — Z008 Encounter for other general examination: Secondary | ICD-10-CM | POA: Diagnosis not present

## 2019-12-31 DIAGNOSIS — I499 Cardiac arrhythmia, unspecified: Secondary | ICD-10-CM

## 2019-12-31 DIAGNOSIS — L089 Local infection of the skin and subcutaneous tissue, unspecified: Secondary | ICD-10-CM | POA: Diagnosis not present

## 2019-12-31 MED ORDER — CEPHALEXIN 500 MG PO CAPS: 500.0000 mg | ORAL_CAPSULE | Freq: Three times a day (TID) | ORAL | 0 refills | Status: DC

## 2019-12-31 NOTE — Patient Instructions (Signed)
Palpitations Palpitations are feelings that your heartbeat is not normal. Your heartbeat may feel like it is:  Uneven.  Faster than normal.  Fluttering.  Skipping a beat. This is usually not a serious problem. In some cases, you may need tests to rule out any serious problems. Follow these instructions at home: Pay attention to any changes in your condition. Take these actions to help manage your symptoms: Eating and drinking  Avoid: ? Coffee, tea, soft drinks, and energy drinks. ? Chocolate. ? Alcohol. ? Diet pills. Lifestyle   Try to lower your stress. These things can help you relax: ? Yoga. ? Deep breathing and meditation. ? Exercise. ? Using words and images to create positive thoughts (guided imagery). ? Using your mind to control things in your body (biofeedback).  Do not use drugs.  Get plenty of rest and sleep. Keep a regular bed time. General instructions   Take over-the-counter and prescription medicines only as told by your doctor.  Do not use any products that contain nicotine or tobacco, such as cigarettes and e-cigarettes. If you need help quitting, ask your doctor.  Keep all follow-up visits as told by your doctor. This is important. You may need more tests if palpitations do not go away or get worse. Contact a doctor if:  Your symptoms last more than 24 hours.  Your symptoms occur more often. Get help right away if you:  Have chest pain.  Feel short of breath.  Have a very bad headache.  Feel dizzy.  Pass out (faint). Summary  Palpitations are feelings that your heartbeat is uneven or faster than normal. It may feel like your heart is fluttering or skipping a beat.  Avoid food and drinks that may cause palpitations. These include caffeine, chocolate, and alcohol.  Try to lower your stress. Do not smoke or use drugs.  Get help right away if you faint or have chest pain, shortness of breath, a severe headache, or dizziness. This  information is not intended to replace advice given to you by your health care provider. Make sure you discuss any questions you have with your health care provider. Document Revised: 08/28/2017 Document Reviewed: 08/28/2017 Elsevier Patient Education  2020 [employer].

## 2019-12-31 NOTE — Progress Notes (Signed)
Subjective:    Patient ID: Dean Yang, male    DOB: May 02, 1976, 44 y.o.   MRN: [id number]  HPI 30 male in non acute distress here for Biometric Screening. No complaints today.Then he mentions approximately a week ago hurt his left 4th with a screw to his finger. Has been soaking it in peroxide nightly.  Review of Systems  Constitutional: Negative for chills and fever.  HENT: Negative for congestion, ear pain, sinus pressure, sinus pain, sneezing and sore throat.   Eyes: Negative for discharge and visual disturbance.  Respiratory: Negative for cough, shortness of breath and wheezing.   Cardiovascular: Negative for chest pain, palpitations and leg swelling.  Gastrointestinal: Negative for diarrhea, nausea and vomiting.  Endocrine: Negative for cold intolerance and heat intolerance.  Genitourinary: Negative for difficulty urinating and dysuria.  Musculoskeletal: Negative for myalgias.  Skin: Negative for rash and wound.  Allergic/Immunologic: Negative for food allergies and immunocompromised state.  Neurological: Negative for dizziness, seizures, syncope, weakness, light-headedness and headaches.  Hematological: Negative for adenopathy.  Psychiatric/Behavioral: Negative for behavioral problems, self-injury, sleep disturbance and suicidal ideas. The patient is not nervous/anxious.   All other systems reviewed and are negative.  Blood pressure 108/62, pulse 62, temperature 98 F (36.7 C), temperature source Temporal, resp. rate 16, height 6' 1"  (1.854 m), weight 162 lb (73.5 kg), SpO2 99 %.  No Known Allergies  .   Current Outpatient Medications on File Prior to Visit  Medication Sig Dispense Refill  . ketorolac (TORADOL) 10 MG tablet Take 1 tablet (10 mg total) by mouth every 8 (eight) hours as needed. 20 tablet 1  . promethazine (PHENERGAN) 12.5 MG tablet Take 1 tablet (12.5 mg total) by mouth every 8 (eight) hours as needed for nausea or vomiting (migraine). 30 tablet 3  .  rizatriptan (MAXALT-MLT) 10 MG disintegrating tablet Take 1 tablet (10 mg total) by mouth as needed for migraine. May repeat in 2 hours if needed 10 tablet 3   No current facility-administered medications on file prior to visit.      Objective:   Physical Exam Vitals and nursing note reviewed.  Constitutional:      Appearance: Normal appearance. He is normal weight.  HENT:     Head: Normocephalic and atraumatic.     Right Ear: Hearing, tympanic membrane, ear canal and external ear normal.     Left Ear: Hearing, tympanic membrane, ear canal and external ear normal.     Nose: Nose normal.     Mouth/Throat:     Lips: Pink.     Mouth: Mucous membranes are moist.     Pharynx: Oropharynx is clear. Uvula midline.  Eyes:     General: Lids are normal. Vision grossly intact. Gaze aligned appropriately.     Extraocular Movements: Extraocular movements intact.     Conjunctiva/sclera: Conjunctivae normal.     Pupils: Pupils are equal, round, and reactive to light.     Funduscopic exam:    Right eye: Red reflex present.        Left eye: Red reflex present. Neck:     Thyroid: No thyromegaly.     Trachea: Trachea and phonation normal.  Cardiovascular:     Rate and Rhythm: Normal rate. Rhythm irregular.     Chest Wall: PMI is not displaced. No thrill.     Pulses: Normal pulses.          Carotid pulses are 2+ on the right side and 2+ on the left side.  Radial pulses are 2+ on the right side and 2+ on the left side.       Dorsalis pedis pulses are 2+ on the right side and 2+ on the left side.       Posterior tibial pulses are 2+ on the right side and 2+ on the left side.     Heart sounds: No murmur. No friction rub. No gallop.   Pulmonary:     Effort: Pulmonary effort is normal.     Breath sounds: Normal breath sounds and air entry.  Abdominal:     General: Abdomen is flat. Bowel sounds are normal.     Palpations: Abdomen is soft.     Tenderness: There is no right CVA tenderness or left  CVA tenderness.     Hernia: There is no hernia in the umbilical area or ventral area.  Musculoskeletal:        General: Normal range of motion.     Cervical back: Normal range of motion and neck supple.     Right lower leg: No edema.     Left lower leg: No edema.  Feet:     Right foot:     Skin integrity: Skin integrity normal.     Toenail Condition: Right toenails are normal.     Left foot:     Skin integrity: Skin integrity normal.     Toenail Condition: Left toenails are normal.  Lymphadenopathy:     Cervical: No cervical adenopathy.     Upper Body:     Right upper body: No supraclavicular or axillary adenopathy.     Left upper body: No supraclavicular or axillary adenopathy.  Skin:    General: Skin is warm and dry.     Capillary Refill: Capillary refill takes less than 2 seconds.  Neurological:     General: No focal deficit present.     Mental Status: He is alert and oriented to person, place, and time. Mental status is at baseline.     GCS: GCS eye subscore is 4. GCS verbal subscore is 5. GCS motor subscore is 6.     Cranial Nerves: Cranial nerves are intact.     Sensory: Sensation is intact.     Motor: Motor function is intact.     Coordination: Coordination is intact.     Gait: Gait is intact.     Deep Tendon Reflexes:     Reflex Scores:      Patellar reflexes are 2+ on the right side and 2+ on the left side. Psychiatric:        Mood and Affect: Mood normal.        Behavior: Behavior normal. Behavior is cooperative.        Thought Content: Thought content normal.        Cognition and Memory: Cognition and memory normal.        Judgment: Judgment normal.   5/5 upper and lower extremity strength 5/5 equal bilateral grips Patient anxious when I listen to his heart.Heart sounded irregular. Asked patient to take a deep breath and try to relax,I  asked why he was so nervous, he stated that he thought it was unusual for me to move my stethascope to different areas on his  chest, I explained to patient that during a cardiac exam there are several areas we exam heart sounds. His pause of beat improved however still sounded irregular to me. Did an EKG which show no irregularity in beat. See results below. Scanned into  chart.   4th left finger DIP abrasion to skin, erythema, swelling and tender to the area. No discharge noted.  EKG HR 59 Early repolarization Lead II ideal Reviewed with Dr.Gilbert, agrees on plan for  patient to follow up with his Primary Dr.Karamalegos He currently has no symptoms of dizziness, lightheaded, Headache, presyncope or syncope. Denies  nausea, vomiting.     Assessment & Plan:  1.Biometric Screening 2.Screening for other  physical exam 3.Left 4th finger infection  Meds ordered this encounter  Medications  . cephALEXin (KEFLEX) 500 MG capsule    Sig: Take 1 capsule (500 mg total) by mouth 3 (three) times daily. Take with food    Dispense:  21 capsule    Refill:  0  Clean twice daily dilute soap and water Triple antibiotic ointment and a bandage to site.Stop peroxide daily.Follow up here or your doctor if area does not improve in 3-5 days or if worsens.  4. Irregular heart beat ( given info on palpations)on auscultation, improved after I suggested patient to take a few deep breaths and to relax. EKG showed early repolarization. Reviewed with Dr. Rosanna Randy agree on plan of patient to follow up with his primary doctor. Patient is currently asymptomatic. And reviewed red flag symptoms of chest pain, shortness of breath, headache, dizziness, lightheaded, passing out to call 911 or to go to the nearest Emergency department, not to drive himself. Labs pending are glucose and lipid panel. Patient also mentioned that phenergan "knocks me out" Asked patient if he ever tried Zofran which usually does have sedative effects, given name of medication and he is to discuss with his primary doctor.  I also recommended patient to start taking protein  shakes due to his height and weight ratio. He weighed himself today  at home which weighed 158  lbs with all of his  equipment on he weighed 191 lbs so that is the difference between his weight at his doctors in March 2021. Patient verbalizes understanding and has no questions at discharge.

## 2020-01-01 LAB — LIPID PANEL
Chol/HDL Ratio: 3.9 ratio (ref 0.0–5.0)
Cholesterol, Total: 201 mg/dL — ABNORMAL HIGH (ref 100–199)
HDL: 52 mg/dL (ref >39)
LDL Chol Calc (NIH): 139 mg/dL — ABNORMAL HIGH (ref 0–99)
Triglycerides: 53 mg/dL (ref 0–149)
VLDL Cholesterol Cal: 10 mg/dL (ref 5–40)

## 2020-01-01 LAB — GLUCOSE, RANDOM: Glucose: 85 mg/dL (ref 65–99)

## 2020-01-05 NOTE — Progress Notes (Signed)
Hi Neoma Laming here are some labs sent to me . For a Biometric screening. Contact me if you have any questions [phone]. Daryll Drown PA-C

## 2020-01-21 NOTE — Addendum Note (Signed)
Addended by: Tujuana Kilmartin, Nira Conn R on: 01/21/2020 11:06 AM   Modules accepted: Level of Service

## 2020-10-22 ENCOUNTER — Other Ambulatory Visit: Payer: Self-pay | Admitting: Family Medicine

## 2020-10-22 NOTE — Telephone Encounter (Signed)
Refill not appropriate. Failed protocol.

## 2021-03-03 ENCOUNTER — Encounter: Payer: Managed Care, Other (non HMO) | Admitting: Nurse Practitioner

## 2021-03-20 ENCOUNTER — Ambulatory Visit: Payer: Managed Care, Other (non HMO) | Admitting: Nurse Practitioner

## 2021-03-20 ENCOUNTER — Other Ambulatory Visit: Payer: Self-pay

## 2021-03-20 VITALS — BP 112/66 | HR 58 | Temp 97.9°F | Resp 16 | Ht 73.0 in | Wt 162.0 lb

## 2021-03-20 DIAGNOSIS — Z0000 Encounter for general adult medical examination without abnormal findings: Secondary | ICD-10-CM

## 2021-03-20 DIAGNOSIS — Z008 Encounter for other general examination: Secondary | ICD-10-CM

## 2021-03-20 NOTE — Progress Notes (Signed)
Subjective:    Patient ID: Dean Yang, male    DOB: 02-13-1976, 45 y.o.   MRN: [id number]  HPI Dean Yang is a 45 y.o. male who presents to the [facility] for his annual biometric screening exam. 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   Past Medical History:  Diagnosis Date   Migraine    Past Surgical History:  Procedure Laterality Date   WISDOM TOOTH EXTRACTION  12/29/2018   SH: Denies use of tobacco, ETOH or drugs, lives with his wife and feels safe at home.  Immunizations: UTD, did not have Covid vaccine but did have Covid 12/21.  Diet/Exercise: walk a lot at work, stays active, no special diet  Current Outpatient Medications on File Prior to Visit  Medication Sig Dispense Refill   ketorolac (TORADOL) 10 MG tablet Take 1 tablet (10 mg total) by mouth every 8 (eight) hours as needed. 20 tablet 1   promethazine (PHENERGAN) 12.5 MG tablet Take 1 tablet (12.5 mg total) by mouth every 8 (eight) hours as needed for nausea or vomiting (migraine). 30 tablet 3   rizatriptan (MAXALT-MLT) 10 MG disintegrating tablet Take 1 tablet (10 mg total) by mouth as needed for migraine. May repeat in 2 hours if needed 10 tablet 3   No current facility-administered medications on file prior to visit.   No Known Allergies  Review of Systems  Neurological:        Hx of migraines  All other systems reviewed and are negative.     Objective: BP 112/66 (BP Location: Left Arm, Patient Position: Sitting, Cuff Size: Normal)   Pulse (!) 58   Temp 97.9 F (36.6 C) (Temporal)   Resp 16   Ht 6' 1"  (1.854 m)   Wt 162 lb (73.5 kg)   SpO2 98%   BMI 21.37 kg/m     Physical Exam Vitals and nursing note reviewed.  Constitutional:      Appearance: Normal appearance. He is well-developed and well-groomed.  HENT:     Head: Normocephalic.     Jaw: There is normal jaw occlusion.     Right Ear: Tympanic membrane, ear canal and external ear  normal.     Left Ear: Tympanic membrane, ear canal and external ear normal.     Nose: Nose normal.     Mouth/Throat:     Lips: Pink.     Mouth: Mucous membranes are moist.     Tongue: No lesions.     Pharynx: Oropharynx is clear.  Eyes:     General: Lids are normal.     Extraocular Movements: Extraocular movements intact.     Conjunctiva/sclera: Conjunctivae normal.     Pupils: Pupils are equal, round, and reactive to light.     Funduscopic exam:    Right eye: No hemorrhage. Red reflex present.        Left eye: No hemorrhage. Red reflex present. Neck:     Thyroid: No thyroid mass or thyroid tenderness.     Vascular: No JVD.     Trachea: Trachea normal.  Cardiovascular:     Rate and Rhythm: Normal rate and regular rhythm.     Heart sounds: No murmur heard. Pulmonary:     Effort: Pulmonary effort is normal.     Breath sounds: Normal breath sounds and air entry.  Abdominal:     General: Bowel sounds are normal.     Palpations: Abdomen is  soft.     Tenderness: There is no abdominal tenderness. There is no right CVA tenderness or left CVA tenderness.  Musculoskeletal:        General: Normal range of motion.     Cervical back: Normal range of motion. No spinous process tenderness or muscular tenderness.     Right lower leg: No edema.     Left lower leg: No edema.  Lymphadenopathy:     Cervical: No cervical adenopathy.  Skin:    General: Skin is warm and dry.  Neurological:     Mental Status: He is alert.     Cranial Nerves: Cranial nerves are intact.     Motor: No weakness, tremor or pronator drift.     Coordination: Romberg sign negative. Coordination normal. Finger-Nose-Finger Test and Heel to [facility] Test normal.     Gait: Gait normal.     Deep Tendon Reflexes:     Reflex Scores:      Bicep reflexes are 2+ on the right side and 2+ on the left side.      Brachioradialis reflexes are 2+ on the right side and 2+ on the left side.      Patellar reflexes are 2+ on the right side  and 2+ on the left side.    Comments: Stands on one foot without difficulty  Psychiatric:        Mood and Affect: Mood normal.        Behavior: Behavior normal.      Assessment & Plan:  1. Encounter for biometric screening - Glucose, random - Lipid panel  2. Encounter for other general examination  3. Encounter for preventive health examination Discussed with the patient clinical findings and plan of care. Patient given the opportunity to ask questions. All questioned fully answered and patient voices understanding. Plan to RTC in one year or sooner if problems.

## 2021-03-21 ENCOUNTER — Encounter: Payer: Self-pay | Admitting: Nurse Practitioner

## 2021-03-21 LAB — LIPID PANEL
Chol/HDL Ratio: 3.7 ratio (ref 0.0–5.0)
Cholesterol, Total: 209 mg/dL — ABNORMAL HIGH (ref 100–199)
HDL: 57 mg/dL (ref >39)
LDL Chol Calc (NIH): 143 mg/dL — ABNORMAL HIGH (ref 0–99)
Triglycerides: 52 mg/dL (ref 0–149)
VLDL Cholesterol Cal: 9 mg/dL (ref 5–40)

## 2021-03-21 LAB — GLUCOSE, RANDOM: Glucose: 94 mg/dL (ref 65–99)

## 2021-11-27 ENCOUNTER — Ambulatory Visit: Payer: Managed Care, Other (non HMO) | Admitting: Family Medicine

## 2021-11-27 ENCOUNTER — Encounter: Payer: Self-pay | Admitting: Family Medicine

## 2021-11-27 VITALS — BP 130/78 | HR 64 | Ht 73.0 in | Wt 170.0 lb

## 2021-11-27 DIAGNOSIS — Z114 Encounter for screening for human immunodeficiency virus [HIV]: Secondary | ICD-10-CM

## 2021-11-27 DIAGNOSIS — Z0000 Encounter for general adult medical examination without abnormal findings: Secondary | ICD-10-CM | POA: Diagnosis not present

## 2021-11-27 DIAGNOSIS — Z1211 Encounter for screening for malignant neoplasm of colon: Secondary | ICD-10-CM

## 2021-11-27 DIAGNOSIS — Z8042 Family history of malignant neoplasm of prostate: Secondary | ICD-10-CM | POA: Diagnosis not present

## 2021-11-27 DIAGNOSIS — G43009 Migraine without aura, not intractable, without status migrainosus: Secondary | ICD-10-CM

## 2021-11-27 DIAGNOSIS — Z125 Encounter for screening for malignant neoplasm of prostate: Secondary | ICD-10-CM | POA: Diagnosis not present

## 2021-11-27 DIAGNOSIS — Z131 Encounter for screening for diabetes mellitus: Secondary | ICD-10-CM

## 2021-11-27 DIAGNOSIS — E7800 Pure hypercholesterolemia, unspecified: Secondary | ICD-10-CM

## 2021-11-27 DIAGNOSIS — Z1159 Encounter for screening for other viral diseases: Secondary | ICD-10-CM

## 2021-11-27 MED ORDER — PROMETHAZINE HCL 12.5 MG PO TABS: 12.5000 mg | ORAL_TABLET | Freq: Three times a day (TID) | ORAL | 5 refills | Status: DC | PRN

## 2021-11-27 MED ORDER — KETOROLAC TROMETHAMINE 10 MG PO TABS: 10.0000 mg | ORAL_TABLET | Freq: Three times a day (TID) | ORAL | 1 refills | Status: DC | PRN

## 2021-11-27 MED ORDER — RIZATRIPTAN BENZOATE 10 MG PO TBDP: 10.0000 mg | ORAL_TABLET | ORAL | 5 refills | Status: DC | PRN

## 2021-11-27 NOTE — Progress Notes (Signed)
? ?Subjective:  ? ? Patient ID: Dean Yang, male    DOB: 1976-02-21, 46 y.o.   MRN: [id number] ? ?Dean Yang is a 46 y.o. male presenting on 11/27/2021 for Annual Exam ? ? ?HPI ? ?Here for Annual Physical and Lab Orders. ? he goes to yearly biometric wellness through [facility]. ? ?Lifestyle ?He is doing well ?Improves diet ?Physically active with exercise and work ? ?Chronic Migraines ?- Chronic history of migraine headaches for years. ?1 headache per month average, describes tension and stress. ? ?- Current trend with Migraine headaches, seem to be once a month approximately, he describes if he can feel a migraine headache coming on with tension in neck and head, and if he takes Rizatriptan PRN and Phenergan half tab PRN but it does make him groggy and sleepy.  ?- He has seen Dr Laveda Abbe for chiropractor for neck adjustment to help with migraines. He keeps a lot of tension in muscles back in neck and seems to be related. Last visit 1 month ago. Previous chiro has retired, now he hasn't returned to new provider. ?- History of MRI Brain in past.  ?- He attributes some headaches to history of falls and prior injuries ?- In past he used to go to hospital in past and get injection toradol ?Denies active headache, nausea vomiting, vision change, dizziness lightheadedness, numbness tingling ? ? ?Health Maintenance: ? ?Due routine for HIV Hep C ? ?Colon CA Screening: Never had colonoscopy screening. Currently asymptomatic. No known family history of colon CA. Due for screening test - will order Cologuard ? ? ? ? ?  11/27/2021  ?  8:45 AM 09/28/2019  ? 10:18 AM  ?Depression screen PHQ 2/9  ?Decreased Interest 0 0  ?Down, Depressed, Hopeless 0 0  ?PHQ - 2 Score 0 0  ?Altered sleeping 0   ?Tired, decreased energy 1   ?Change in appetite 0   ?Feeling bad or failure about yourself  0   ?Trouble concentrating 1   ?Moving slowly or fidgety/restless 0   ?Suicidal thoughts 0   ?PHQ-9 Score 2   ?Difficult doing work/chores  Not difficult at all   ? ? ?Past Medical History:  ?Diagnosis Date  ? Migraine   ? ?Past Surgical History:  ?Procedure Laterality Date  ? WISDOM TOOTH EXTRACTION  12/29/2018  ? ?Social History  ? ?Socioeconomic History  ? Marital status: Married  ?  Spouse name: Not on file  ? Number of children: Not on file  ? Years of education: High School  ? Highest education level: High school graduate  ?Occupational History  ? Occupation: Sheriff  ?Tobacco Use  ? Smoking status: Former  ?  Packs/day: 1.00  ?  Years: 15.00  ?  Pack years: 15.00  ?  Types: Cigarettes  ?  Quit date: 07/30/2005  ?  Years since quitting: 16.3  ? Smokeless tobacco: Never  ?Substance and Sexual Activity  ? Alcohol use: Yes  ?  Alcohol/week: 0.0 standard drinks  ?  Comment: 1 drink per month approx  ? Drug use: No  ? Sexual activity: Yes  ?  Partners: Female  ?Other Topics Concern  ? Not on file  ?Social History Narrative  ? Not on file  ? ?Social Determinants of Health  ? ?Financial Resource Strain: Not on file  ?Food Insecurity: Not on file  ?Transportation Needs: Not on file  ?Physical Activity: Not on file  ?Stress: Not on file  ?Social Connections: Not on  file  ?Intimate Partner Violence: Not on file  ? ?Family History  ?Problem Relation Age of Onset  ? Heart disease Father 65  ?     MI, CABG  ? Prostate cancer Father 50  ? Diabetes Maternal Grandmother   ? Stroke Maternal Grandfather   ? Cancer Paternal Grandmother   ?     lung cancer, smoker  ? Cancer Paternal Grandfather   ?     lung cancer, smoker  ? ?No current outpatient medications on file prior to visit.  ? ?No current facility-administered medications on file prior to visit.  ? ? ?Review of Systems  ?Constitutional:  Negative for activity change, appetite change, chills, diaphoresis, fatigue and fever.  ?HENT:  Negative for congestion and hearing loss.   ?Eyes:  Negative for visual disturbance.  ?Respiratory:  Negative for cough, chest tightness, shortness of breath and wheezing.    ?Cardiovascular:  Negative for chest pain, palpitations and leg swelling.  ?Gastrointestinal:  Negative for abdominal pain, constipation, diarrhea, nausea and vomiting.  ?Genitourinary:  Negative for dysuria, frequency and hematuria.  ?Musculoskeletal:  Negative for arthralgias and neck pain.  ?Skin:  Negative for rash.  ?Neurological:  Negative for dizziness, weakness, light-headedness, numbness and headaches.  ?Hematological:  Negative for adenopathy.  ?Psychiatric/Behavioral:  Negative for behavioral problems, dysphoric mood and sleep disturbance.   ?Per HPI unless specifically indicated above ? ? ?   ?Objective:  ?  ?BP 130/78   Pulse 64   Ht 6' 1"  (1.854 m)   Wt 170 lb (77.1 kg)   SpO2 98%   BMI 22.43 kg/m?   ?Wt Readings from Last 3 Encounters:  ?11/27/21 170 lb (77.1 kg)  ?03/20/21 162 lb (73.5 kg)  ?12/31/19 162 lb (73.5 kg)  ?  ?Physical Exam ?Vitals and nursing note reviewed.  ?Constitutional:   ?   General: He is not in acute distress. ?   Appearance: He is well-developed. He is not diaphoretic.  ?   Comments: Well-appearing, comfortable, cooperative  ?HENT:  ?   Head: Normocephalic and atraumatic.  ?Eyes:  ?   General:     ?   Right eye: No discharge.     ?   Left eye: No discharge.  ?   Conjunctiva/sclera: Conjunctivae normal.  ?   Pupils: Pupils are equal, round, and reactive to light.  ?Neck:  ?   Thyroid: No thyromegaly.  ?   Vascular: No carotid bruit.  ?Cardiovascular:  ?   Rate and Rhythm: Normal rate and regular rhythm.  ?   Pulses: Normal pulses.  ?   Heart sounds: Normal heart sounds. No murmur heard. ?Pulmonary:  ?   Effort: Pulmonary effort is normal. No respiratory distress.  ?   Breath sounds: Normal breath sounds. No wheezing or rales.  ?Abdominal:  ?   General: Bowel sounds are normal. There is no distension.  ?   Palpations: Abdomen is soft. There is no mass.  ?   Tenderness: There is no abdominal tenderness.  ?Musculoskeletal:     ?   General: No tenderness. Normal range of  motion.  ?   Cervical back: Normal range of motion and neck supple.  ?   Right lower leg: No edema.  ?   Left lower leg: No edema.  ?   Comments: Upper / Lower Extremities: ?- Normal muscle tone, strength bilateral upper extremities 5/5, lower extremities 5/5  ?Lymphadenopathy:  ?   Cervical: No cervical adenopathy.  ?Skin: ?  General: Skin is warm and dry.  ?   Findings: No erythema or rash.  ?Neurological:  ?   Mental Status: He is alert and oriented to person, place, and time.  ?   Comments: Distal sensation intact to light touch all extremities  ?Psychiatric:     ?   Mood and Affect: Mood normal.     ?   Behavior: Behavior normal.     ?   Thought Content: Thought content normal.  ?   Comments: Well groomed, good eye contact, normal speech and thoughts  ? ? ? ? ?Results for orders placed or performed in visit on 11/27/21  ?CBC with Differential/Platelet  ?Result Value Ref Range  ? WBC 3.4 (L) 3.8 - 10.8 Thousand/uL  ? RBC 4.70 4.20 - 5.80 Million/uL  ? Hemoglobin 14.1 13.2 - 17.1 g/dL  ? HCT 42.2 38.5 - 50.0 %  ? MCV 89.8 80.0 - 100.0 fL  ? MCH 30.0 27.0 - 33.0 pg  ? MCHC 33.4 32.0 - 36.0 g/dL  ? RDW 12.1 11.0 - 15.0 %  ? Platelets 196 140 - 400 Thousand/uL  ? MPV 11.0 7.5 - 12.5 fL  ? Neutro Abs 1,884 1,500 - 7,800 cells/uL  ? Lymphs Abs 1,224 850 - 3,900 cells/uL  ? Absolute Monocytes 231 200 - 950 cells/uL  ? Eosinophils Absolute 41 15 - 500 cells/uL  ? Basophils Absolute 20 0 - 200 cells/uL  ? Neutrophils Relative % 55.4 %  ? Total Lymphocyte 36.0 %  ? Monocytes Relative 6.8 %  ? Eosinophils Relative 1.2 %  ? Basophils Relative 0.6 %  ? ?   ?Assessment & Plan:  ? ?Problem List Items Addressed This Visit   ?None ?Visit Diagnoses   ? ? Annual physical exam    -  Primary  ? Relevant Orders  ? COMPLETE METABOLIC PANEL WITH GFR  ? CBC with Differential/Platelet (Completed)  ? Lipid panel  ? Hemoglobin A1c  ? Pure hypercholesterolemia      ? Relevant Orders  ? COMPLETE METABOLIC PANEL WITH GFR  ? Lipid panel  ?  Screening for prostate cancer      ? Relevant Orders  ? PSA  ? Family history of prostate cancer      ? Relevant Orders  ? PSA  ? Screening for diabetes mellitus (DM)      ? Screening for HIV (human immunodeficiency virus

## 2021-11-27 NOTE — Patient Instructions (Addendum)
Thank you for coming to the office today. ? ?Labs today. ? ?Medicines refilled. ? ?Nurtec ODT ? ?Ordered the Cologuard (home kit) test for colon cancer screening. Stay tuned for further updates. ? ?It will be shipped to you directly. If not received in 2-4 weeks, call us or the company. ?  ?If you send it back and no results are received in 2-4 weeks, call us or the company as well! ?  ?Colon Cancer Screening: ?- For all adults age 47+ routine colon cancer screening is highly recommended. ?    - Recent guidelines from [place] recommend starting age of 36 ?- Early detection of colon cancer is important, because often there are no warning signs or symptoms, also if found early usually it can be cured. Late stage is hard to treat. ?  ?- If Cologuard is NEGATIVE, then it is good for 3 years before next due ?- If Cologuard is POSITIVE, then it is strongly advised to get a Colonoscopy, which allows the GI doctor to locate the source of the cancer or polyp (even very early stage) and treat it by removing it. ?------------------------- ?Follow instructions to collect sample, you may call the company for any help or questions, 24/7 telephone support at [phone]. ? ? ? ?Please schedule a Follow-up Appointment to: Return in about 1 year (around 11/28/2022) for 1 year Annual Physical fasting lab AFTER in AM. ? ?If you have any other questions or concerns, please feel free to call the office or send a message through [facility] [facility]. You may also schedule an earlier appointment if necessary. ? ?Additionally, you may be receiving a survey about your experience at our office within a few days to 1 week by e-mail or mail. We value your feedback. ? ?Nobie Putnam, DO ?[facility] ?

## 2021-11-28 LAB — CBC WITH DIFFERENTIAL/PLATELET
Absolute Monocytes: 231 cells/uL (ref 200–950)
Basophils Absolute: 20 cells/uL (ref 0–200)
Basophils Relative: 0.6 %
Eosinophils Absolute: 41 cells/uL (ref 15–500)
Eosinophils Relative: 1.2 %
HCT: 42.2 % (ref 38.5–50.0)
Hemoglobin: 14.1 g/dL (ref 13.2–17.1)
Lymphs Abs: 1224 cells/uL (ref 850–3900)
MCH: 30 pg (ref 27.0–33.0)
MCHC: 33.4 g/dL (ref 32.0–36.0)
MCV: 89.8 fL (ref 80.0–100.0)
MPV: 11 fL (ref 7.5–12.5)
Monocytes Relative: 6.8 %
Neutro Abs: 1884 cells/uL (ref 1500–7800)
Neutrophils Relative %: 55.4 %
Platelets: 196 10*3/uL (ref 140–400)
RBC: 4.7 10*6/uL (ref 4.20–5.80)
RDW: 12.1 % (ref 11.0–15.0)
Total Lymphocyte: 36 %
WBC: 3.4 10*3/uL — ABNORMAL LOW (ref 3.8–10.8)

## 2021-11-28 LAB — COMPLETE METABOLIC PANEL WITH GFR
AG Ratio: 1.8 (calc) (ref 1.0–2.5)
ALT: 20 U/L (ref 9–46)
AST: 17 U/L (ref 10–40)
Albumin: 4.6 g/dL (ref 3.6–5.1)
Alkaline phosphatase (APISO): 53 U/L (ref 36–130)
BUN: 14 mg/dL (ref 7–25)
CO2: 30 mmol/L (ref 20–32)
Calcium: 10 mg/dL (ref 8.6–10.3)
Chloride: 105 mmol/L (ref 98–110)
Creat: 1.06 mg/dL (ref 0.60–1.29)
Globulin: 2.5 g/dL (calc) (ref 1.9–3.7)
Glucose, Bld: 96 mg/dL (ref 65–139)
Potassium: 5 mmol/L (ref 3.5–5.3)
Sodium: 143 mmol/L (ref 135–146)
Total Bilirubin: 0.6 mg/dL (ref 0.2–1.2)
Total Protein: 7.1 g/dL (ref 6.1–8.1)
eGFR: 88 mL/min/{1.73_m2} (ref >=60)

## 2021-11-28 LAB — HEMOGLOBIN A1C
Hgb A1c MFr Bld: 5.3 % of total Hgb (ref <5.7)
Mean Plasma Glucose: 105 mg/dL
eAG (mmol/L): 5.8 mmol/L

## 2021-11-28 LAB — LIPID PANEL
Cholesterol: 201 mg/dL — ABNORMAL HIGH (ref <200)
HDL: 56 mg/dL (ref >=40)
LDL Cholesterol (Calc): 128 mg/dL (calc) — ABNORMAL HIGH
Non-HDL Cholesterol (Calc): 145 mg/dL (calc) — ABNORMAL HIGH (ref <130)
Total CHOL/HDL Ratio: 3.6 (calc) (ref <5.0)
Triglycerides: 73 mg/dL (ref <150)

## 2021-11-28 LAB — HEPATITIS C ANTIBODY
Hepatitis C Ab: NONREACTIVE
SIGNAL TO CUT-OFF: 0.15 (ref <1.00)

## 2021-11-28 LAB — PSA: PSA: 1.42 ng/mL (ref <=4.00)

## 2021-11-28 LAB — HIV ANTIBODY (ROUTINE TESTING W REFLEX): HIV 1&2 Ab, 4th Generation: NONREACTIVE

## 2021-12-02 LAB — COLOGUARD

## 2021-12-21 LAB — COLOGUARD: COLOGUARD: NEGATIVE

## 2022-11-21 ENCOUNTER — Other Ambulatory Visit: Payer: Self-pay | Admitting: Family Medicine

## 2022-11-21 DIAGNOSIS — G43009 Migraine without aura, not intractable, without status migrainosus: Secondary | ICD-10-CM

## 2022-11-21 NOTE — Telephone Encounter (Signed)
Requested Prescriptions  Pending Prescriptions Disp Refills   rizatriptan (MAXALT-MLT) 10 MG disintegrating tablet [Pharmacy Med Name: RIZATRIPTAN ODT  TABLETS] 10 tablet 0    Sig: TAKE 1 TABLET BY MOUTH AS NEEDED FOR MIGRAINE. MAY REPEAT IN 2 HOURS IF NEEDED.     Neurology:  Migraine Therapy - Triptan Passed - 11/21/2022  3:13 AM      Passed - Last BP in normal range    BP Readings from Last 1 Encounters:  11/27/21 130/78         Passed - Valid encounter within last 12 months    Recent Outpatient Visits           11 months ago Annual physical exam   [facility] Smitty Cords, DO   3 years ago Chronic migraine   [facility] [place], Netta Neat, DO       Future Appointments             In 1 week Althea Charon, Netta Neat, DO [facility], [facility]

## 2022-11-30 ENCOUNTER — Ambulatory Visit (INDEPENDENT_AMBULATORY_CARE_PROVIDER_SITE_OTHER): Payer: Managed Care, Other (non HMO) | Admitting: Family Medicine

## 2022-11-30 ENCOUNTER — Encounter: Payer: Self-pay | Admitting: Family Medicine

## 2022-11-30 VITALS — BP 102/70 | HR 66 | Ht 73.0 in | Wt 174.0 lb

## 2022-11-30 DIAGNOSIS — Z125 Encounter for screening for malignant neoplasm of prostate: Secondary | ICD-10-CM

## 2022-11-30 DIAGNOSIS — Z0000 Encounter for general adult medical examination without abnormal findings: Secondary | ICD-10-CM

## 2022-11-30 DIAGNOSIS — Z8042 Family history of malignant neoplasm of prostate: Secondary | ICD-10-CM

## 2022-11-30 DIAGNOSIS — E7800 Pure hypercholesterolemia, unspecified: Secondary | ICD-10-CM

## 2022-11-30 DIAGNOSIS — Z131 Encounter for screening for diabetes mellitus: Secondary | ICD-10-CM

## 2022-11-30 DIAGNOSIS — G43909 Migraine, unspecified, not intractable, without status migrainosus: Secondary | ICD-10-CM

## 2022-11-30 LAB — CBC WITH DIFFERENTIAL/PLATELET
Absolute Monocytes: 192 cells/uL — ABNORMAL LOW (ref 200–950)
Eosinophils Relative: 1.3 %
RBC: 4.73 10*6/uL (ref 4.20–5.80)

## 2022-11-30 MED ORDER — NURTEC 75 MG PO TBDP: 1.0000 mg | ORAL_TABLET | ORAL | 0 refills | Status: AC

## 2022-11-30 NOTE — Patient Instructions (Addendum)
Thank you for coming to the office today.  Trial the Nurtec ODT 75mg , migraine stopping pill that is also a dissolving pill. Take it at start of a significant headache, and it is done dose in 24-48 hours. Take instead of the Rizatriptan see how it goes. It is a brand name med, that if it works really well, we can try to get approved. Let me know.  -----  Labs today, stay tuned for results on the MyChart.  We will complete your Biometric Form and fax it back.  1 YEAR - DUE for FASTING BLOOD WORK (no food or drink after midnight before the lab appointment, only water or coffee without cream/sugar on the morning of)  Lab draw in the morning AFTER your visit  Please schedule a Follow-up Appointment to: Return in about 1 year (around 11/30/2023) for 1 year Annual Physical AM fasting lab AFTER.  If you have any other questions or concerns, please feel free to call the office or send a message through MyChart. You may also schedule an earlier appointment if necessary.  Additionally, you may be receiving a survey about your experience at our office within a few days to 1 week by e-mail or mail. We value your feedback.  Saralyn Pilar, DO [facility], [place]

## 2022-11-30 NOTE — Progress Notes (Signed)
Subjective:    Patient ID: Dean Yang, male    DOB: [birth year], 47 y.o.   MRN: [id number]  Dean Yang is a 47 y.o. male presenting on 11/30/2022 for Annual Exam   HPI  Here for Annual Physical and Lab Orders. He goes to yearly biometric wellness through [facility].   Lifestyle He is doing well Improves diet Physically active with exercise and work. He is doing running exercise in evening. And walking with wife, and upcoming 5k    Chronic Migraines - Chronic history of migraine headaches for years. 1 headache per month average, describes tension and stress.   - Current trend with Migraine headaches, seem to be once a month approximately, he describes if he can feel a migraine headache coming on with tension in neck and head, and if he takes Rizatriptan PRN and Phenergan half tab PRN but it does make him groggy and sleepy.   He is doing even better on these meds the Rizatriptan works well. He has plenty of meds left. Taking it less often. Also has Phenergan, no new refill needed  Has seen local Chiropractor Dr Duwayne Heck a few times.  - History of MRI Brain in past.  - He attributes some headaches to history of falls and prior injuries Denies active headache, nausea vomiting, vision change, dizziness lightheadedness, numbness tingling     Health Maintenance:  Colon CA Screening: Cologuard completed 12/11/21 negative. Repeat in 3 years, or 2026. Currently asymptomatic. No known family history of colon CA.      11/30/2022    8:23 AM 11/27/2021    8:45 AM 09/28/2019   10:18 AM  Depression screen PHQ 2/9  Decreased Interest 0 0 0  Down, Depressed, Hopeless 0 0 0  PHQ - 2 Score 0 0 0  Altered sleeping  0   Tired, decreased energy  1   Change in appetite  0   Feeling bad or failure about yourself   0   Trouble concentrating  1   Moving slowly or fidgety/restless  0   Suicidal thoughts  0   PHQ-9 Score  2   Difficult doing work/chores  Not difficult at all      Past Medical History:  Diagnosis Date   Migraine    Past Surgical History:  Procedure Laterality Date   WISDOM TOOTH EXTRACTION  12/29/2018   Social History   Socioeconomic History   Marital status: Married    Spouse name: Not on file   Number of children: Not on file   Years of education: High School   Highest education level: High school graduate  Occupational History   Occupation: Sheriff  Tobacco Use   Smoking status: Former    Packs/day: 1.00    Years: 15.00    Additional pack years: 0.00    Total pack years: 15.00    Types: Cigarettes    Quit date: 07/30/2005    Years since quitting: 17.3   Smokeless tobacco: Never  Substance and Sexual Activity   Alcohol use: Yes    Alcohol/week: 0.0 standard drinks of alcohol    Comment: 1 drink per month approx   Drug use: No   Sexual activity: Yes    Partners: Female  Other Topics Concern   Not on file  Social History Narrative   Not on file   Social Determinants of Health   Financial Resource Strain: Not on file  Food Insecurity: Not on file  Transportation Needs: Not on file  Physical Activity: Not on file  Stress: Not on file  Social Connections: Not on file  Intimate Partner Violence: Not on file   Family History  Problem Relation Age of Onset   Heart disease Father 54       MI, CABG   Prostate cancer Father 32   Diabetes Maternal Grandmother    Stroke Maternal Grandfather    Cancer Paternal Grandmother        lung cancer, smoker   Cancer Paternal Grandfather        lung cancer, smoker   Current Outpatient Medications on File Prior to Visit  Medication Sig   ketorolac (TORADOL) 10 MG tablet Take 1 tablet (10 mg total) by mouth every 8 (eight) hours as needed for moderate pain.   promethazine (PHENERGAN) 12.5 MG tablet Take 1 tablet (12.5 mg total) by mouth every 8 (eight) hours as needed for nausea or vomiting.   rizatriptan (MAXALT-MLT) 10 MG disintegrating tablet TAKE 1 TABLET BY MOUTH AS NEEDED  FOR MIGRAINE. MAY REPEAT IN 2 HOURS IF NEEDED.   No current facility-administered medications on file prior to visit.    Review of Systems  Constitutional:  Negative for activity change, appetite change, chills, diaphoresis, fatigue and fever.  HENT:  Negative for congestion and hearing loss.   Eyes:  Negative for visual disturbance.  Respiratory:  Negative for cough, chest tightness, shortness of breath and wheezing.   Cardiovascular:  Negative for chest pain, palpitations and leg swelling.  Gastrointestinal:  Negative for abdominal pain, constipation, diarrhea, nausea and vomiting.  Genitourinary:  Negative for dysuria, frequency and hematuria.  Musculoskeletal:  Negative for arthralgias and neck pain.  Skin:  Negative for rash.  Neurological:  Negative for dizziness, weakness, light-headedness, numbness and headaches.  Hematological:  Negative for adenopathy.  Psychiatric/Behavioral:  Negative for behavioral problems, dysphoric mood and sleep disturbance.    Per HPI unless specifically indicated above      Objective:    BP 102/70   Pulse 66   Ht 6\' 1"  (1.854 m)   Wt 174 lb (78.9 kg)   SpO2 99%   BMI 22.96 kg/m   Wt Readings from Last 3 Encounters:  11/30/22 174 lb (78.9 kg)  11/27/21 170 lb (77.1 kg)  03/20/21 162 lb (73.5 kg)    Physical Exam Vitals and nursing note reviewed.  Constitutional:      General: He is not in acute distress.    Appearance: He is well-developed. He is not diaphoretic.     Comments: Well-appearing, comfortable, cooperative  HENT:     Head: Normocephalic and atraumatic.     Right Ear: Tympanic membrane, ear canal and external ear normal. There is no impacted cerumen.     Left Ear: Tympanic membrane, ear canal and external ear normal. There is no impacted cerumen.  Eyes:     General:        Right eye: No discharge.        Left eye: No discharge.     Conjunctiva/sclera: Conjunctivae normal.     Pupils: Pupils are equal, round, and reactive  to light.  Neck:     Thyroid: No thyromegaly.     Vascular: No carotid bruit.  Cardiovascular:     Rate and Rhythm: Normal rate and regular rhythm.     Pulses: Normal pulses.     Heart sounds: Normal heart sounds. No murmur heard. Pulmonary:     Effort: Pulmonary effort is normal. No respiratory distress.  Breath sounds: Normal breath sounds. No wheezing or rales.  Abdominal:     General: Bowel sounds are normal. There is no distension.     Palpations: Abdomen is soft. There is no mass.     Tenderness: There is no abdominal tenderness.  Musculoskeletal:        General: No tenderness. Normal range of motion.     Cervical back: Normal range of motion and neck supple.     Right lower leg: No edema.     Left lower leg: No edema.     Comments: Upper / Lower Extremities: - Normal muscle tone, strength bilateral upper extremities 5/5, lower extremities 5/5  Lymphadenopathy:     Cervical: No cervical adenopathy.  Skin:    General: Skin is warm and dry.     Findings: No erythema or rash.  Neurological:     Mental Status: He is alert and oriented to person, place, and time.     Comments: Distal sensation intact to light touch all extremities  Psychiatric:        Mood and Affect: Mood normal.        Behavior: Behavior normal.        Thought Content: Thought content normal.     Comments: Well groomed, good eye contact, normal speech and thoughts      Results for orders placed or performed in visit on 11/30/22  CBC with Differential/Platelet  Result Value Ref Range   WBC 3.0 (L) 3.8 - 10.8 Thousand/uL   RBC 4.73 4.20 - 5.80 Million/uL   Hemoglobin 14.0 13.2 - 17.1 g/dL   HCT 16.1 09.6 - 04.5 %   MCV 91.3 80.0 - 100.0 fL   MCH 29.6 27.0 - 33.0 pg   MCHC 32.4 32.0 - 36.0 g/dL   RDW 40.9 81.1 - 91.4 %   Platelets 210 140 - 400 Thousand/uL   MPV 10.3 7.5 - 12.5 fL   Neutro Abs 1,575 1,500 - 7,800 cells/uL   Lymphs Abs 1,164 850 - 3,900 cells/uL   Absolute Monocytes 192 (L) 200  - 950 cells/uL   Eosinophils Absolute 39 15 - 500 cells/uL   Basophils Absolute 30 0 - 200 cells/uL   Neutrophils Relative % 52.5 %   Total Lymphocyte 38.8 %   Monocytes Relative 6.4 %   Eosinophils Relative 1.3 %   Basophils Relative 1.0 %      Assessment & Plan:   Problem List Items Addressed This Visit     Episodic migraine   Relevant Medications   Rimegepant Sulfate (NURTEC) 75 MG TBDP   Other Visit Diagnoses     Annual physical exam    -  Primary   Relevant Orders   COMPLETE METABOLIC PANEL WITH GFR   CBC with Differential/Platelet (Completed)   Hemoglobin A1c   Lipid panel   PSA   TSH   Pure hypercholesterolemia       Relevant Orders   Lipid panel   TSH   Screening for prostate cancer       Relevant Orders   PSA   Family history of prostate cancer       Relevant Orders   PSA   Screening for diabetes mellitus (DM)       Relevant Orders   Hemoglobin A1c       Updated Health Maintenance information Fasting labs today, pending results. Encouraged improvement to lifestyle with diet and exercise Goal of weight loss  Screening Work Biometric Form - will completed once  lab results are in and fax.  Episodic Migraine headaches >4-14 HA days per month Migraines resolved w/ Triptan effectively.  Re order Rizatriptan ODT 10mg  AS NEEDED Also sample Nurtec ODT 75 as back up option if interested to trial  Meds ordered this encounter  Medications   Rimegepant Sulfate (NURTEC) 75 MG TBDP    Sig: Take 0.0133 tablets (1 mg total) by mouth as directed.    Dispense:  2 tablet    Refill:  0      Follow up plan: Return in about 1 year (around 11/30/2023) for 1 year Annual Physical AM fasting lab AFTER.  Saralyn Pilar, DO [facility] 11/30/2022, 8:14 AM

## 2022-12-01 LAB — CBC WITH DIFFERENTIAL/PLATELET
Basophils Absolute: 30 cells/uL (ref 0–200)
Basophils Relative: 1 %
Eosinophils Absolute: 39 cells/uL (ref 15–500)
HCT: 43.2 % (ref 38.5–50.0)
Hemoglobin: 14 g/dL (ref 13.2–17.1)
Lymphs Abs: 1164 cells/uL (ref 850–3900)
MCH: 29.6 pg (ref 27.0–33.0)
MCHC: 32.4 g/dL (ref 32.0–36.0)
MCV: 91.3 fL (ref 80.0–100.0)
MPV: 10.3 fL (ref 7.5–12.5)
Monocytes Relative: 6.4 %
Neutro Abs: 1575 cells/uL (ref 1500–7800)
Neutrophils Relative %: 52.5 %
Platelets: 210 10*3/uL (ref 140–400)
RDW: 12.1 % (ref 11.0–15.0)
Total Lymphocyte: 38.8 %
WBC: 3 10*3/uL — ABNORMAL LOW (ref 3.8–10.8)

## 2022-12-01 LAB — COMPLETE METABOLIC PANEL WITH GFR
AG Ratio: 2 (calc) (ref 1.0–2.5)
ALT: 23 U/L (ref 9–46)
AST: 21 U/L (ref 10–40)
Albumin: 4.7 g/dL (ref 3.6–5.1)
Alkaline phosphatase (APISO): 60 U/L (ref 36–130)
BUN: 17 mg/dL (ref 7–25)
CO2: 29 mmol/L (ref 20–32)
Calcium: 9.7 mg/dL (ref 8.6–10.3)
Chloride: 104 mmol/L (ref 98–110)
Creat: 1.15 mg/dL (ref 0.60–1.29)
Globulin: 2.4 g/dL (calc) (ref 1.9–3.7)
Glucose, Bld: 92 mg/dL (ref 65–99)
Potassium: 4.4 mmol/L (ref 3.5–5.3)
Sodium: 140 mmol/L (ref 135–146)
Total Bilirubin: 0.6 mg/dL (ref 0.2–1.2)
Total Protein: 7.1 g/dL (ref 6.1–8.1)
eGFR: 79 mL/min/{1.73_m2} (ref >=60)

## 2022-12-01 LAB — LIPID PANEL
Cholesterol: 250 mg/dL — ABNORMAL HIGH (ref <200)
HDL: 58 mg/dL (ref >=40)
LDL Cholesterol (Calc): 171 mg/dL (calc) — ABNORMAL HIGH
Non-HDL Cholesterol (Calc): 192 mg/dL (calc) — ABNORMAL HIGH (ref <130)
Total CHOL/HDL Ratio: 4.3 (calc) (ref <5.0)
Triglycerides: 95 mg/dL (ref <150)

## 2022-12-01 LAB — HEMOGLOBIN A1C
Hgb A1c MFr Bld: 5.5 % of total Hgb (ref <5.7)
Mean Plasma Glucose: 111 mg/dL
eAG (mmol/L): 6.2 mmol/L

## 2022-12-01 LAB — TSH: TSH: 3.04 mIU/L (ref 0.40–4.50)

## 2022-12-01 LAB — PSA: PSA: 1.13 ng/mL (ref <=4.00)

## 2023-05-13 ENCOUNTER — Other Ambulatory Visit: Payer: Self-pay | Admitting: Family Medicine

## 2023-05-13 DIAGNOSIS — G43009 Migraine without aura, not intractable, without status migrainosus: Secondary | ICD-10-CM

## 2023-05-13 MED ORDER — RIZATRIPTAN BENZOATE 10 MG PO TBDP
10.0000 mg | ORAL_TABLET | ORAL | 0 refills | Status: DC | PRN
Start: 2023-05-13 — End: 2023-12-04

## 2023-05-14 NOTE — Telephone Encounter (Signed)
Requested medication (s) are due for refill today -expired rx  Requested medication (s) are on the active medication list -yes  Future visit scheduled -yes  Last refill: 11/27/21 #20 1RF  Notes to clinic: expired Rx- sent for review   Requested Prescriptions  Pending Prescriptions Disp Refills   ketorolac (TORADOL) 10 MG tablet [Pharmacy Med Name: KETOROLAC 10MG  TABLETS] 20 tablet 1    Sig: TAKE 1 TABLET(10 MG) BY MOUTH EVERY 8 HOURS AS NEEDED FOR MODERATE PAIN     Analgesics: NSAIDS 2 Passed - 05/13/2023  8:07 PM      Passed - HGB in normal range and within 360 days    Hemoglobin  Date Value Ref Range Status  11/30/2022 14.0 13.2 - 17.1 g/dL Final  21/30/8657 84.6 13.0 - 17.7 g/dL Final         Passed - Cr in normal range and within 360 days    Creat  Date Value Ref Range Status  11/30/2022 1.15 0.60 - 1.29 mg/dL Final         Passed - Patient is not pregnant      Passed - Valid encounter within last 12 months    Recent Outpatient Visits           5 months ago Annual physical exam   [place] [facility] Smitty Cords, DO   1 year ago Annual physical exam   [place] [facility] [place] Smitty Cords, DO   3 years ago Chronic migraine   [place] [facility] [place], Netta Neat, DO       Future Appointments             In 6 months Althea Charon, Netta Neat, DO [place] [facility], [facility]               Requested Prescriptions  Pending Prescriptions Disp Refills   ketorolac (TORADOL) 10 MG tablet [Pharmacy Med Name: KETOROLAC 10MG  TABLETS] 20 tablet 1    Sig: TAKE 1 TABLET(10 MG) BY MOUTH EVERY 8 HOURS AS NEEDED FOR MODERATE PAIN     Analgesics: NSAIDS 2 Passed - 05/13/2023  8:07 PM      Passed - HGB in normal range and within 360 days    Hemoglobin  Date Value Ref Range Status  11/30/2022 14.0 13.2 - 17.1 g/dL Final  96/29/5284 13.2 13.0 - 17.7 g/dL Final          Passed - Cr in normal range and within 360 days    Creat  Date Value Ref Range Status  11/30/2022 1.15 0.60 - 1.29 mg/dL Final         Passed - Patient is not pregnant      Passed - Valid encounter within last 12 months    Recent Outpatient Visits           5 months ago Annual physical exam   [place] [facility] Smitty Cords, DO   1 year ago Annual physical exam   [place] [facility] Smitty Cords, DO   3 years ago Chronic migraine   [place] [facility] [place], Netta Neat, DO       Future Appointments             In 6 months Althea Charon, Netta Neat, DO [place] [facility], [facility]

## 2023-12-04 ENCOUNTER — Ambulatory Visit: Payer: Self-pay | Admitting: Family Medicine

## 2023-12-04 VITALS — BP 110/72 | HR 63 | Ht 73.0 in | Wt 169.2 lb

## 2023-12-04 DIAGNOSIS — G43909 Migraine, unspecified, not intractable, without status migrainosus: Secondary | ICD-10-CM

## 2023-12-04 DIAGNOSIS — E7800 Pure hypercholesterolemia, unspecified: Secondary | ICD-10-CM

## 2023-12-04 DIAGNOSIS — Z131 Encounter for screening for diabetes mellitus: Secondary | ICD-10-CM | POA: Diagnosis not present

## 2023-12-04 DIAGNOSIS — Z0189 Encounter for other specified special examinations: Secondary | ICD-10-CM

## 2023-12-04 DIAGNOSIS — Z8042 Family history of malignant neoplasm of prostate: Secondary | ICD-10-CM | POA: Diagnosis not present

## 2023-12-04 DIAGNOSIS — Z0000 Encounter for general adult medical examination without abnormal findings: Secondary | ICD-10-CM

## 2023-12-04 MED ORDER — PROMETHAZINE HCL 12.5 MG PO TABS: 12.5000 mg | ORAL_TABLET | Freq: Three times a day (TID) | ORAL | 1 refills | Status: AC | PRN

## 2023-12-04 MED ORDER — RIZATRIPTAN BENZOATE 10 MG PO TBDP: 10.0000 mg | ORAL_TABLET | ORAL | 2 refills | Status: AC | PRN

## 2023-12-04 MED ORDER — KETOROLAC TROMETHAMINE 10 MG PO TABS: 10.0000 mg | ORAL_TABLET | Freq: Three times a day (TID) | ORAL | 1 refills | Status: AC | PRN

## 2023-12-04 NOTE — Progress Notes (Signed)
 Subjective:    Patient ID: RACE HIDER, male    DOB: [birth year], 48 y.o.   MRN: [id number]  Dean Yang is a 48 y.o. male presenting on 12/04/2023 for Annual Exam   HPI  Discussed the use of AI scribe software for clinical note transcription with the patient, who gave verbal consent to proceed.  History of Present Illness   Dean Yang is a 48 year old male who presents for an annual physical exam and routine blood work.  He is participating in a health screening program that requires certain health metrics to be submitted for insurance purposes. He has fasted since last night, except for a little black coffee this morning, in preparation for the blood tests.  He has a persistent knot behind his ear, described as a 'bean' under the skin. It has been present for a long time without changes in size or consistency. He inquires about it during the visit, noting that it does not come and go.      Episodic Migraines - Prior  history of migraine headaches for years. 1-2 headache per month average, describes tension and stress.  He experiences migraines approximately twice a month and uses rizatriptan  as a preventative measure when he feels a migraine coming on. He prefers this approach to avoid the full onset of a migraine. He also has Toradol  and Phenergan  available but tries to avoid Phenergan  due to its sedative effects, which make him feel groggy the next day. He keeps his medications both at home and in his backpack for convenience.  Improved if he takes Rizatriptan  ODT 10mg  AS NEEDED      Health Maintenance:  Last Cologuard 12/11/21 negative, repeat 3 years, 11/2024     12/04/2023    8:38 AM 11/30/2022    8:23 AM 11/27/2021    8:45 AM  Depression screen PHQ 2/9  Decreased Interest 0 0 0  Down, Depressed, Hopeless 0 0 0  PHQ - 2 Score 0 0 0  Altered sleeping   0  Tired, decreased energy   1  Change in appetite   0  Feeling bad or failure about yourself    0  Trouble  concentrating   1  Moving slowly or fidgety/restless   0  Suicidal thoughts   0  PHQ-9 Score   2  Difficult doing work/chores   Not difficult at all       12/04/2023    8:38 AM 11/30/2022    8:23 AM 11/27/2021    8:45 AM  GAD 7 : Generalized Anxiety Score  Nervous, Anxious, on Edge 0 0 0  Control/stop worrying 0 0 0  Worry too much - different things 0 0 0  Trouble relaxing 0 0 0  Restless  0 0  Easily annoyed or irritable 0 0 1  Afraid - awful might happen 0 0 1  Total GAD 7 Score  0 2  Anxiety Difficulty   Not difficult at all     Past Medical History:  Diagnosis Date   Migraine    Past Surgical History:  Procedure Laterality Date   WISDOM TOOTH EXTRACTION  12/29/2018   Social History   Socioeconomic History   Marital status: Married    Spouse name: Not on file   Number of children: Not on file   Years of education: High School   Highest education level: 12th grade  Occupational History   Occupation: Sheriff  Tobacco Use   Smoking status: Former  Current packs/day: 0.00    Average packs/day: 1 pack/day for 15.0 years (15.0 ttl pk-yrs)    Types: Cigarettes    Start date: 07/30/1990    Quit date: 07/30/2005    Years since quitting: 18.3   Smokeless tobacco: Never  Substance and Sexual Activity   Alcohol use: Yes    Alcohol/week: 0.0 standard drinks of alcohol    Comment: 1 drink per month approx   Drug use: No   Sexual activity: Yes    Partners: Female  Other Topics Concern   Not on file  Social History Narrative   Not on file   Social Drivers of Health   Financial Resource Strain: Low Risk  (12/04/2023)   Overall Financial Resource Strain (CARDIA)    Difficulty of Paying Living Expenses: Not hard at all  Food Insecurity: No Food Insecurity (12/04/2023)   Hunger Vital Sign    Worried About Running Out of Food in the Last Year: Never true    Ran Out of Food in the Last Year: Never true  Transportation Needs: No Transportation Needs (12/04/2023)   PRAPARE -  Administrator, Civil Service (Medical): No    Lack of Transportation (Non-Medical): No  Physical Activity: Insufficiently Active (12/04/2023)   Exercise Vital Sign    Days of Exercise per Week: 3 days    Minutes of Exercise per Session: 10 min  Stress: No Stress Concern Present (12/04/2023)   [employer] of Occupational Health - Occupational Stress Questionnaire    Feeling of Stress : Only a little  Social Connections: Moderately Integrated (12/04/2023)   Social Connection and Isolation Panel [NHANES]    Frequency of Communication with Friends and Family: Once a week    Frequency of Social Gatherings with Friends and Family: Once a week    Attends Religious Services: More than 4 times per year    Active Member of [employer] or Organizations: Yes    Attends Engineer, structural: More than 4 times per year    Marital Status: Married  Catering manager Violence: Not on file   Family History  Problem Relation Age of Onset   Heart disease Father 4       MI, CABG   Prostate cancer Father 37   Diabetes Maternal Grandmother    Stroke Maternal Grandfather    Cancer Paternal Grandmother        lung cancer, smoker   Cancer Paternal Grandfather        lung cancer, smoker   Current Outpatient Medications on File Prior to Visit  Medication Sig   Rimegepant Sulfate (NURTEC) 75 MG TBDP Take 0.0133 tablets (1 mg total) by mouth as directed. (Patient not taking: Reported on 12/04/2023)   No current facility-administered medications on file prior to visit.    Review of Systems  Constitutional:  Negative for activity change, appetite change, chills, diaphoresis, fatigue and fever.  HENT:  Negative for congestion and hearing loss.   Eyes:  Negative for visual disturbance.  Respiratory:  Negative for cough, chest tightness, shortness of breath and wheezing.   Cardiovascular:  Negative for chest pain, palpitations and leg swelling.  Gastrointestinal:  Negative for abdominal pain,  constipation, diarrhea, nausea and vomiting.  Genitourinary:  Negative for dysuria, frequency and hematuria.  Musculoskeletal:  Negative for arthralgias and neck pain.  Skin:  Negative for rash.  Neurological:  Negative for dizziness, weakness, light-headedness, numbness and headaches.  Hematological:  Negative for adenopathy.  Psychiatric/Behavioral:  Negative for behavioral  problems, dysphoric mood and sleep disturbance.    Per HPI unless specifically indicated above     Objective:    BP 110/72 (BP Location: Left Arm, Patient Position: Sitting, Cuff Size: Normal)   Pulse 63   Ht 6\' 1"  (1.854 m)   Wt 169 lb 4 oz (76.8 kg)   SpO2 99%   BMI 22.33 kg/m   Wt Readings from Last 3 Encounters:  12/04/23 169 lb 4 oz (76.8 kg)  11/30/22 174 lb (78.9 kg)  11/27/21 170 lb (77.1 kg)    Physical Exam Vitals and nursing note reviewed.  Constitutional:      General: He is not in acute distress.    Appearance: He is well-developed. He is not diaphoretic.     Comments: Well-appearing, comfortable, cooperative  HENT:     Head: Normocephalic and atraumatic.     Right Ear: Tympanic membrane, ear canal and external ear normal. There is no impacted cerumen.     Left Ear: Tympanic membrane, ear canal and external ear normal. There is no impacted cerumen.     Ears:     Comments: R posterior auricular area with slight fullness localized nodular density not consistent with lymph node seems very superficial, non tender. Chronic issue Eyes:     General:        Right eye: No discharge.        Left eye: No discharge.     Conjunctiva/sclera: Conjunctivae normal.     Pupils: Pupils are equal, round, and reactive to light.  Neck:     Thyroid: No thyromegaly.     Vascular: No carotid bruit.  Cardiovascular:     Rate and Rhythm: Normal rate and regular rhythm.     Pulses: Normal pulses.     Heart sounds: Normal heart sounds. No murmur heard. Pulmonary:     Effort: Pulmonary effort is normal. No  respiratory distress.     Breath sounds: Normal breath sounds. No wheezing or rales.  Abdominal:     General: Bowel sounds are normal. There is no distension.     Palpations: Abdomen is soft. There is no mass.     Tenderness: There is no abdominal tenderness.  Musculoskeletal:        General: No tenderness. Normal range of motion.     Cervical back: Normal range of motion and neck supple.     Right lower leg: No edema.     Left lower leg: No edema.     Comments: Upper / Lower Extremities: - Normal muscle tone, strength bilateral upper extremities 5/5, lower extremities 5/5  Lymphadenopathy:     Cervical: No cervical adenopathy.  Skin:    General: Skin is warm and dry.     Findings: No erythema or rash.  Neurological:     Mental Status: He is alert and oriented to person, place, and time.     Comments: Distal sensation intact to light touch all extremities  Psychiatric:        Mood and Affect: Mood normal.        Behavior: Behavior normal.        Thought Content: Thought content normal.     Comments: Well groomed, good eye contact, normal speech and thoughts     Results for orders placed or performed in visit on 11/30/22  COMPLETE METABOLIC PANEL WITH GFR   Collection Time: 11/30/22  8:34 AM  Result Value Ref Range   Glucose, Bld 92 65 - 99 mg/dL  BUN 17 7 - 25 mg/dL   Creat 1.61 0.96 - 0.45 mg/dL   eGFR 79 > OR = 60 WU/JWJ/1.91Y7   BUN/Creatinine Ratio SEE NOTE: 6 - 22 (calc)   Sodium 140 135 - 146 mmol/L   Potassium 4.4 3.5 - 5.3 mmol/L   Chloride 104 98 - 110 mmol/L   CO2 29 20 - 32 mmol/L   Calcium 9.7 8.6 - 10.3 mg/dL   Total Protein 7.1 6.1 - 8.1 g/dL   Albumin 4.7 3.6 - 5.1 g/dL   Globulin 2.4 1.9 - 3.7 g/dL (calc)   AG Ratio 2.0 1.0 - 2.5 (calc)   Total Bilirubin 0.6 0.2 - 1.2 mg/dL   Alkaline phosphatase (APISO) 60 36 - 130 U/L   AST 21 10 - 40 U/L   ALT 23 9 - 46 U/L  CBC with Differential/Platelet   Collection Time: 11/30/22  8:34 AM  Result Value Ref  Range   WBC 3.0 (L) 3.8 - 10.8 Thousand/uL   RBC 4.73 4.20 - 5.80 Million/uL   Hemoglobin 14.0 13.2 - 17.1 g/dL   HCT 82.9 56.2 - 13.0 %   MCV 91.3 80.0 - 100.0 fL   MCH 29.6 27.0 - 33.0 pg   MCHC 32.4 32.0 - 36.0 g/dL   RDW 86.5 78.4 - 69.6 %   Platelets 210 140 - 400 Thousand/uL   MPV 10.3 7.5 - 12.5 fL   Neutro Abs 1,575 1,500 - 7,800 cells/uL   Lymphs Abs 1,164 850 - 3,900 cells/uL   Absolute Monocytes 192 (L) 200 - 950 cells/uL   Eosinophils Absolute 39 15 - 500 cells/uL   Basophils Absolute 30 0 - 200 cells/uL   Neutrophils Relative % 52.5 %   Total Lymphocyte 38.8 %   Monocytes Relative 6.4 %   Eosinophils Relative 1.3 %   Basophils Relative 1.0 %  Hemoglobin A1c   Collection Time: 11/30/22  8:34 AM  Result Value Ref Range   Hgb A1c MFr Bld 5.5 <5.7 % of total Hgb   Mean Plasma Glucose 111 mg/dL   eAG (mmol/L) 6.2 mmol/L  Lipid panel   Collection Time: 11/30/22  8:34 AM  Result Value Ref Range   Cholesterol 250 (H) <200 mg/dL   HDL 58 > OR = 40 mg/dL   Triglycerides 95 <295 mg/dL   LDL Cholesterol (Calc) 171 (H) mg/dL (calc)   Total CHOL/HDL Ratio 4.3 <5.0 (calc)   Non-HDL Cholesterol (Calc) 192 (H) <130 mg/dL (calc)  PSA   Collection Time: 11/30/22  8:34 AM  Result Value Ref Range   PSA 1.13 < OR = 4.00 ng/mL  TSH   Collection Time: 11/30/22  8:34 AM  Result Value Ref Range   TSH 3.04 0.40 - 4.50 mIU/L      Assessment & Plan:   Problem List Items Addressed This Visit     Episodic migraine   Relevant Medications   rizatriptan  (MAXALT -MLT) 10 MG disintegrating tablet   promethazine  (PHENERGAN ) 12.5 MG tablet   ketorolac  (TORADOL ) 10 MG tablet   Other Visit Diagnoses       Annual physical exam    -  Primary   Relevant Orders   Lipid panel   Hemoglobin A1c   CBC with Differential/Platelet   PSA   TSH   Comprehensive metabolic panel with GFR     Pure hypercholesterolemia       Relevant Orders   Lipid panel   CBC with Differential/Platelet   TSH    Comprehensive  metabolic panel with GFR     Family history of prostate cancer       Relevant Orders   PSA     Screening for diabetes mellitus (DM)       Relevant Orders   Hemoglobin A1c     Encounter for tobacco use screening       Relevant Orders   NICOTINE/COTININE SP        Updated Health Maintenance information Fasting labs ordered today Encouraged improvement to lifestyle with diet and exercise  Episodic Migraine Migraine headaches occur twice monthly. Rizatriptan  used preventatively. Phenergan  causes grogginess, used last. Nurtec sample available for trial never took from last visit - Refill rizatriptan , Phenergan , Toradol . - Try Nurtec sample for early migraine treatment, maybe we can order in future if approved. - 1st line is Rizatriptan  - He uses Toradol  AS NEEDED if need - Last resort is Phenergan  - Contact provider if additional medication needed.  Wellness Visit Reviewed last year's blood work. Discussed optional heart scan for arterial calcification detection. Discussed shingles vaccine for age 49+, noting risk of shingles post-varicella. - Order complete blood panel: chemistry, kidney, liver, blood count, glucose, cholesterol, prostate, thyroid. - Complete biometric form, submit via fax. Once results come back - Consider heart scan for arterial calcification. - Consider shingles vaccine at age 86+.      Added Cotinine test as well as required by his employer.    Orders Placed This Encounter  Procedures   Lipid panel    Has the patient fasted?:   Yes   Hemoglobin A1c   CBC with Differential/Platelet   PSA   TSH   Comprehensive metabolic panel with GFR    Has the patient fasted?:   Yes   NICOTINE/COTININE SP    Meds ordered this encounter  Medications   rizatriptan  (MAXALT -MLT) 10 MG disintegrating tablet    Sig: Take 1 tablet (10 mg total) by mouth as needed for migraine. May repeat in 2 hours if neededTAKE 1 TABLET BY MOUTH AS NEEDED FOR  MIGRAINE. MAY REPEAT IN 2 HOURS IF NEEDED.    Dispense:  10 tablet    Refill:  2   promethazine  (PHENERGAN ) 12.5 MG tablet    Sig: Take 1 tablet (12.5 mg total) by mouth every 8 (eight) hours as needed for nausea or vomiting.    Dispense:  30 tablet    Refill:  1   ketorolac  (TORADOL ) 10 MG tablet    Sig: Take 1 tablet (10 mg total) by mouth every 8 (eight) hours as needed for moderate pain (pain score 4-6) (migraine).    Dispense:  20 tablet    Refill:  1     Follow up plan: Return for 1 year fasting lab > 1 week later Annual Physical.  Domingo Friend, DO [facility] 12/04/2023, 8:16 AM

## 2023-12-04 NOTE — Patient Instructions (Addendum)
 Thank you for coming to the office today.  Labs today  We will complete the Biometric form and submit it back by fax.  Future Shingrix vaccine 50+  Refilled all 3 medications to the pharmacy.  Don't forget to try the Nurtec ODT dissolving as needed for early migraine, see if you like it. Can replace sample or order in future.  FUTURE CONSIDER - let me know if interested. Coronary Calcium Score Cardiac CT Scan. This is a screening test for patients aged 49-49+ with cardiovascular risk factors or who are healthy but would be interested in Cardiovascular Screening for heart disease. Even if there is a family history of heart disease, this imaging can be useful. Typically it can be done every 5+ years or at a different timeline we agree on  The scan will look at the chest and mainly focus on the heart and identify early signs of calcium build up or blockages within the heart arteries. It is not 100% accurate for identifying blockages or heart disease, but it is useful to help us  predict who may have some early changes or be at risk in the future for a heart attack or cardiovascular problem.  The results are reviewed by a Cardiologist and they will document the results. It should become available on MyChart. Typically the results are divided into percentiles based on other patients of the same demographic and age. So it will compare your risk to others similar to you. If you have a higher score >99 or higher percentile >75%tile, it is recommended to consider Statin cholesterol therapy and or referral to Cardiologist. I will try to help explain your results and if we have questions we can contact the Cardiologist.  You will be contacted for scheduling. Usually it is done at any imaging facility through [facility], [facility] or [facility].  The cost is $99 flat fee total and it does not go through insurance, so no authorization is required.  DUE for FASTING BLOOD  WORK (no food or drink after midnight before the lab appointment, only water or coffee without cream/sugar on the morning of)  SCHEDULE "Lab Only" visit in the morning at the clinic for lab draw in 1 YEAR  - Make sure Lab Only appointment is at about 1 week before your next appointment, so that results will be available  For Lab Results, once available within 2-3 days of blood draw, you can can log in to MyChart online to view your results and a brief explanation. Also, we can discuss results at next follow-up visit.   Please schedule a Follow-up Appointment to: Return for 1 year fasting lab > 1 week later Annual Physical.  If you have any other questions or concerns, please feel free to call the office or send a message through MyChart. You may also schedule an earlier appointment if necessary.  Additionally, you may be receiving a survey about your experience at our office within a few days to 1 week by e-mail or mail. We value your feedback.  Domingo Friend, DO [facility], [place]

## 2023-12-05 ENCOUNTER — Encounter: Payer: Self-pay | Admitting: Family Medicine

## 2023-12-08 LAB — LIPID PANEL
Cholesterol: 239 mg/dL — ABNORMAL HIGH (ref <200)
HDL: 61 mg/dL (ref >=40)
LDL Cholesterol (Calc): 157 mg/dL — ABNORMAL HIGH
Non-HDL Cholesterol (Calc): 178 mg/dL — ABNORMAL HIGH (ref <130)
Total CHOL/HDL Ratio: 3.9 (calc) (ref <5.0)
Triglycerides: 99 mg/dL (ref <150)

## 2023-12-08 LAB — CBC WITH DIFFERENTIAL/PLATELET
Absolute Lymphocytes: 1228 {cells}/uL (ref 850–3900)
Absolute Monocytes: 234 {cells}/uL (ref 200–950)
Basophils Absolute: 10 {cells}/uL (ref 0–200)
Basophils Relative: 0.3 %
Eosinophils Absolute: 50 {cells}/uL (ref 15–500)
Eosinophils Relative: 1.5 %
HCT: 43.8 % (ref 38.5–50.0)
Hemoglobin: 14.6 g/dL (ref 13.2–17.1)
MCH: 30.7 pg (ref 27.0–33.0)
MCHC: 33.3 g/dL (ref 32.0–36.0)
MCV: 92 fL (ref 80.0–100.0)
MPV: 10.3 fL (ref 7.5–12.5)
Monocytes Relative: 7.1 %
Neutro Abs: 1779 {cells}/uL (ref 1500–7800)
Neutrophils Relative %: 53.9 %
Platelets: 202 10*3/uL (ref 140–400)
RBC: 4.76 10*6/uL (ref 4.20–5.80)
RDW: 12.2 % (ref 11.0–15.0)
Total Lymphocyte: 37.2 %
WBC: 3.3 10*3/uL — ABNORMAL LOW (ref 3.8–10.8)

## 2023-12-08 LAB — COMPREHENSIVE METABOLIC PANEL WITH GFR
AG Ratio: 2.2 (calc) (ref 1.0–2.5)
ALT: 19 U/L (ref 9–46)
AST: 18 U/L (ref 10–40)
Albumin: 4.9 g/dL (ref 3.6–5.1)
Alkaline phosphatase (APISO): 60 U/L (ref 36–130)
BUN: 11 mg/dL (ref 7–25)
CO2: 30 mmol/L (ref 20–32)
Calcium: 9.9 mg/dL (ref 8.6–10.3)
Chloride: 102 mmol/L (ref 98–110)
Creat: 1.16 mg/dL (ref 0.60–1.29)
Globulin: 2.2 g/dL (ref 1.9–3.7)
Glucose, Bld: 88 mg/dL (ref 65–99)
Potassium: 4.9 mmol/L (ref 3.5–5.3)
Sodium: 139 mmol/L (ref 135–146)
Total Bilirubin: 1 mg/dL (ref 0.2–1.2)
Total Protein: 7.1 g/dL (ref 6.1–8.1)
eGFR: 78 mL/min/{1.73_m2} (ref >=60)

## 2023-12-08 LAB — HEMOGLOBIN A1C
Hgb A1c MFr Bld: 5.6 % (ref <5.7)
Mean Plasma Glucose: 114 mg/dL
eAG (mmol/L): 6.3 mmol/L

## 2023-12-08 LAB — PSA: PSA: 1.35 ng/mL (ref <=4.00)

## 2023-12-08 LAB — NICOTINE/COTININE SP
Cotinine: <2 ng/mL
Nicotine screen: <2 ng/mL

## 2024-12-04 ENCOUNTER — Other Ambulatory Visit

## 2024-12-11 ENCOUNTER — Encounter: Admitting: Family Medicine
